# Patient Record
Sex: Male | Born: 1945 | Race: White | Hispanic: No | Marital: Married | Smoking: Current every day smoker
Health system: Southern US, Community
[De-identification: ages and names within clinical notes are randomized; demographics above are authoritative.]

## PROBLEM LIST (undated history)

## (undated) DIAGNOSIS — E7800 Pure hypercholesterolemia, unspecified: Secondary | ICD-10-CM

## (undated) DIAGNOSIS — D751 Secondary polycythemia: Secondary | ICD-10-CM

## (undated) DIAGNOSIS — K219 Gastro-esophageal reflux disease without esophagitis: Secondary | ICD-10-CM

## (undated) DIAGNOSIS — F1020 Alcohol dependence, uncomplicated: Secondary | ICD-10-CM

## (undated) DIAGNOSIS — I10 Essential (primary) hypertension: Secondary | ICD-10-CM

## (undated) DIAGNOSIS — M1990 Unspecified osteoarthritis, unspecified site: Secondary | ICD-10-CM

## (undated) DIAGNOSIS — J449 Chronic obstructive pulmonary disease, unspecified: Secondary | ICD-10-CM

## (undated) DIAGNOSIS — IMO0002 Reserved for concepts with insufficient information to code with codable children: Secondary | ICD-10-CM

## (undated) DIAGNOSIS — Z87891 Personal history of nicotine dependence: Secondary | ICD-10-CM

## (undated) HISTORY — DX: Unspecified osteoarthritis, unspecified site: M19.90

## (undated) HISTORY — DX: Chronic obstructive pulmonary disease, unspecified: J44.9

## (undated) HISTORY — DX: Personal history of nicotine dependence: Z87.891

## (undated) HISTORY — DX: Pure hypercholesterolemia, unspecified: E78.00

## (undated) HISTORY — DX: Reserved for concepts with insufficient information to code with codable children: IMO0002

## (undated) HISTORY — DX: Secondary polycythemia: D75.1

## (undated) HISTORY — DX: Essential (primary) hypertension: I10

## (undated) HISTORY — DX: Alcohol dependence, uncomplicated: F10.20

## (undated) HISTORY — DX: Gastro-esophageal reflux disease without esophagitis: K21.9

## (undated) HISTORY — PX: CHOLECYSTECTOMY: SHX55

---

## 2005-04-05 ENCOUNTER — Ambulatory Visit (HOSPITAL_COMMUNITY): Admission: RE | Admit: 2005-04-05 | Discharge: 2005-04-05 | Payer: Self-pay | Admitting: Pulmonary Disease

## 2005-04-05 ENCOUNTER — Ambulatory Visit: Payer: Self-pay | Admitting: Cardiology

## 2005-04-11 ENCOUNTER — Ambulatory Visit (HOSPITAL_COMMUNITY): Admission: RE | Admit: 2005-04-11 | Discharge: 2005-04-11 | Payer: Self-pay | Admitting: Internal Medicine

## 2005-04-26 ENCOUNTER — Ambulatory Visit (HOSPITAL_COMMUNITY): Admission: RE | Admit: 2005-04-26 | Discharge: 2005-04-26 | Payer: Self-pay | Admitting: Internal Medicine

## 2008-03-23 ENCOUNTER — Ambulatory Visit (HOSPITAL_COMMUNITY): Admission: RE | Admit: 2008-03-23 | Discharge: 2008-03-23 | Payer: Self-pay | Admitting: Internal Medicine

## 2008-03-29 ENCOUNTER — Emergency Department (HOSPITAL_COMMUNITY): Admission: EM | Admit: 2008-03-29 | Discharge: 2008-03-29 | Payer: Self-pay | Admitting: Emergency Medicine

## 2008-04-11 ENCOUNTER — Emergency Department (HOSPITAL_COMMUNITY): Admission: EM | Admit: 2008-04-11 | Discharge: 2008-04-11 | Payer: Self-pay | Admitting: Emergency Medicine

## 2008-04-13 ENCOUNTER — Encounter (HOSPITAL_COMMUNITY): Admission: RE | Admit: 2008-04-13 | Discharge: 2008-05-13 | Payer: Self-pay | Admitting: Emergency Medicine

## 2008-04-28 ENCOUNTER — Ambulatory Visit (HOSPITAL_COMMUNITY): Admission: RE | Admit: 2008-04-28 | Discharge: 2008-04-28 | Payer: Self-pay | Admitting: Internal Medicine

## 2009-11-01 ENCOUNTER — Other Ambulatory Visit: Admission: RE | Admit: 2009-11-01 | Discharge: 2009-11-01 | Payer: Self-pay | Admitting: Otolaryngology

## 2009-11-07 ENCOUNTER — Ambulatory Visit (HOSPITAL_COMMUNITY): Admission: RE | Admit: 2009-11-07 | Discharge: 2009-11-07 | Payer: Self-pay | Admitting: Internal Medicine

## 2009-11-14 ENCOUNTER — Encounter (HOSPITAL_COMMUNITY): Admission: RE | Admit: 2009-11-14 | Discharge: 2009-12-14 | Payer: Self-pay | Admitting: Internal Medicine

## 2009-11-23 ENCOUNTER — Ambulatory Visit (HOSPITAL_COMMUNITY): Payer: Self-pay | Admitting: Oncology

## 2009-11-23 ENCOUNTER — Encounter (HOSPITAL_COMMUNITY): Admission: RE | Admit: 2009-11-23 | Discharge: 2009-12-23 | Payer: Self-pay | Admitting: Oncology

## 2009-12-27 ENCOUNTER — Encounter (HOSPITAL_COMMUNITY): Admission: RE | Admit: 2009-12-27 | Discharge: 2010-01-26 | Payer: Self-pay | Admitting: Oncology

## 2010-02-06 ENCOUNTER — Ambulatory Visit (HOSPITAL_COMMUNITY): Payer: Self-pay | Admitting: Oncology

## 2010-02-06 ENCOUNTER — Encounter (HOSPITAL_COMMUNITY): Admission: RE | Admit: 2010-02-06 | Discharge: 2010-03-08 | Payer: Self-pay | Admitting: Oncology

## 2010-02-12 IMAGING — CT CT ANGIO ABDOMEN
1 of 4 series · 13 of 46 positions shown, 17 images · IV contrast (omnipaque)
Comparison: None

CTA CHEST

CLINICAL DATA: Chest and abdominal pain.  Rule out aortic
dissection.

CT ANGIOGRAPHY CHEST AND ABDOMEN
TECHNIQUE: Multidetector CT imaging of the chest and abdomen was
performed using the standard protocol during bolus administration
of intravenous contrast. Multiplanar CT image reconstructions were
obtained to evaluate the vascular anatomy.
Contrast: 100 ml Omnipaque 350

[Series 5: dissection 3.0 b40f · axial · 0.73mm/px · z∈[-509,-92]mm · 13 of 157 slices shown, 17 images]
[im 9/157  soft-tissue]
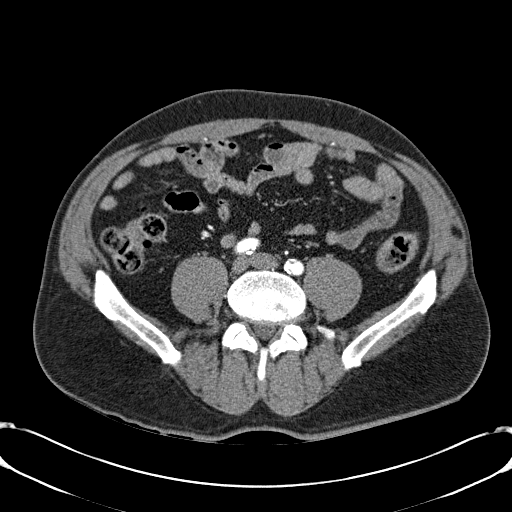
[im 9/157  bone]
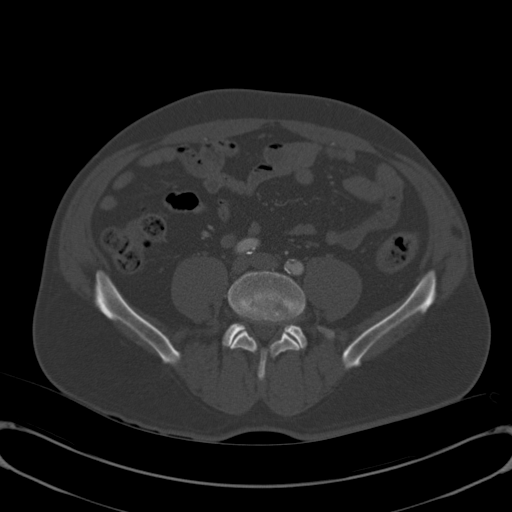
[im 25/157  soft-tissue]
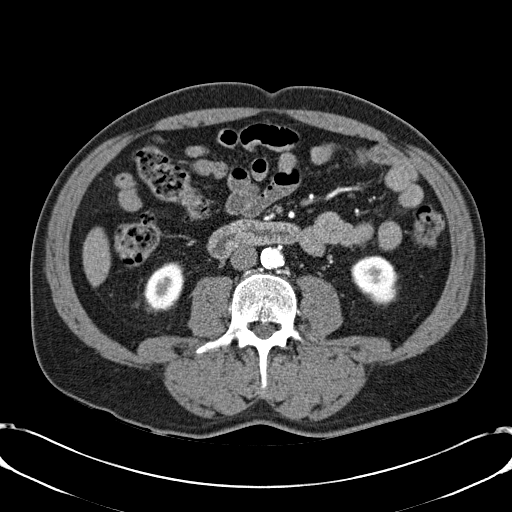
[im 42/157  soft-tissue]
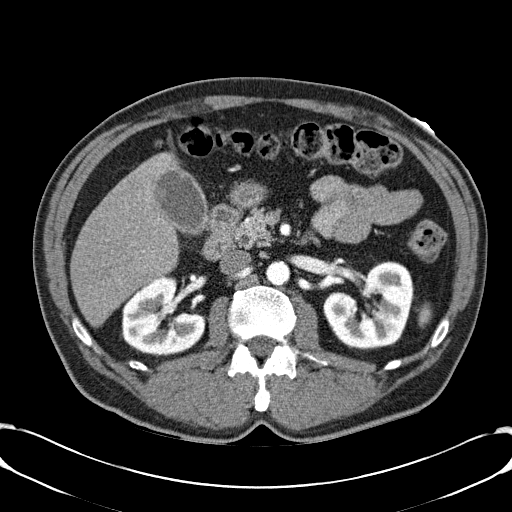
[im 50/157  soft-tissue]
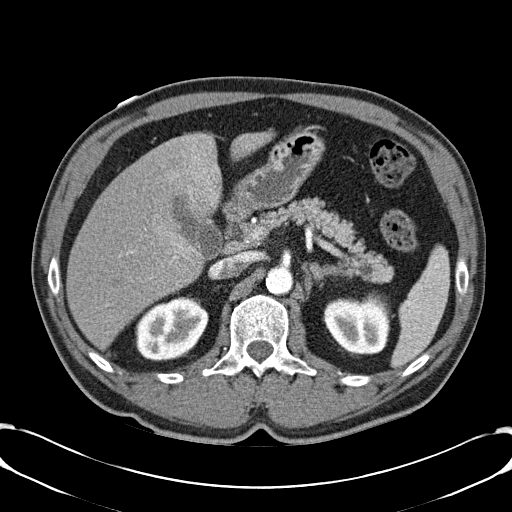
[im 66/157  soft-tissue]
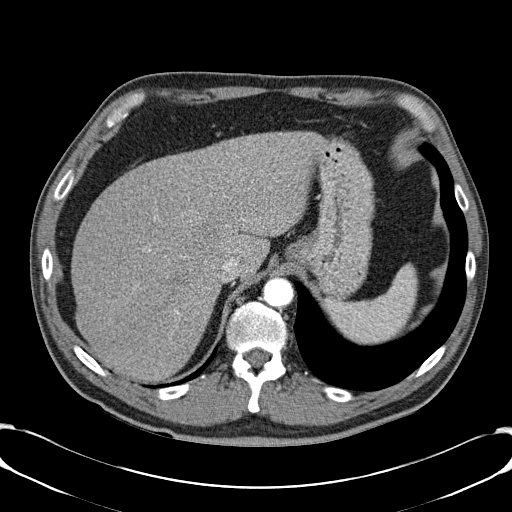
[im 83/157  soft-tissue]
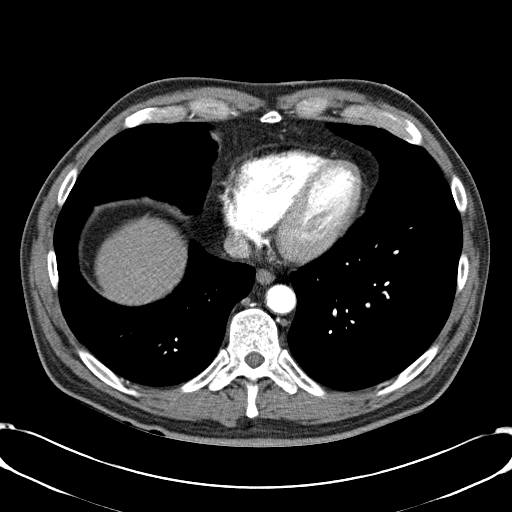
[im 91/157  soft-tissue]
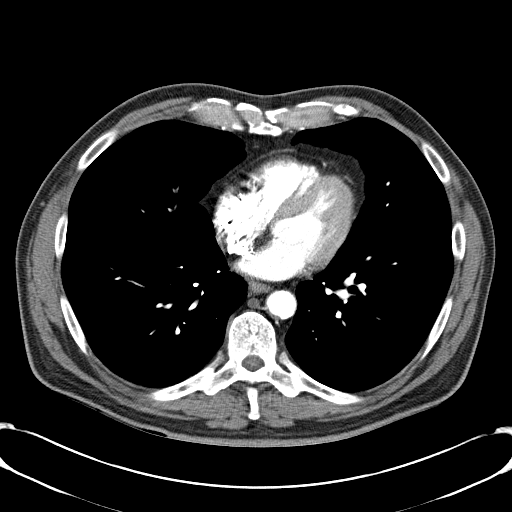
[im 107/157  soft-tissue]
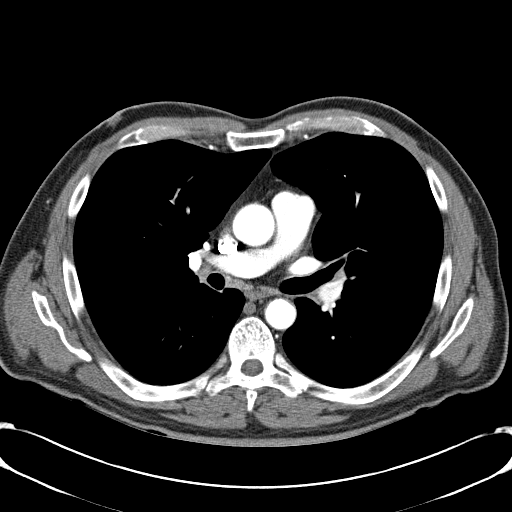
[im 115/157  soft-tissue]
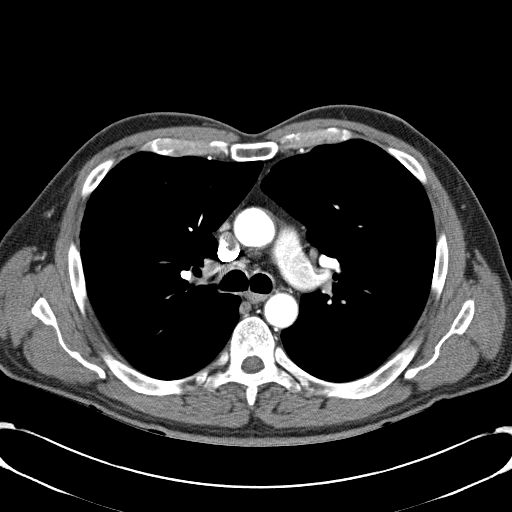
[im 115/157  bone]
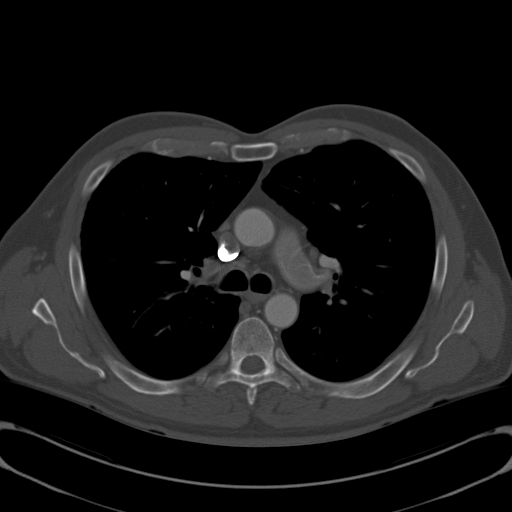
[im 124/157  lung]
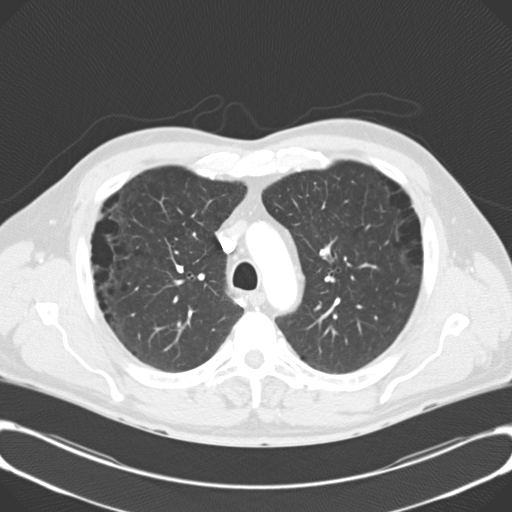
[im 132/157  soft-tissue]
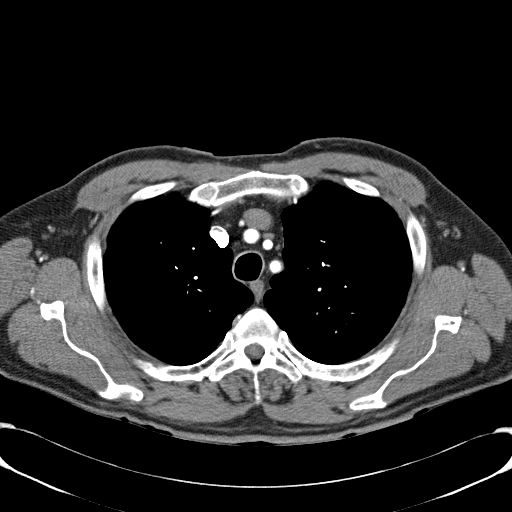
[im 132/157  lung]
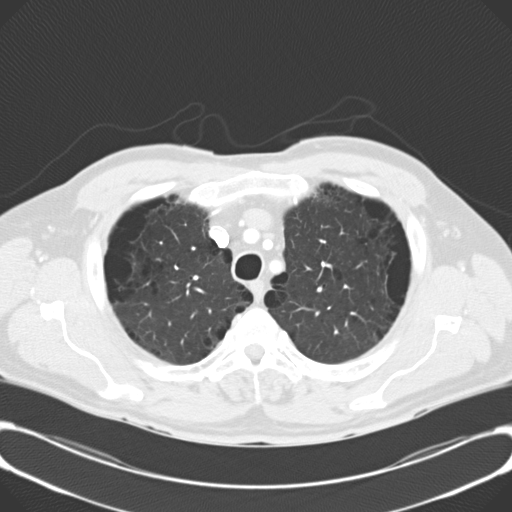
[im 140/157  lung]
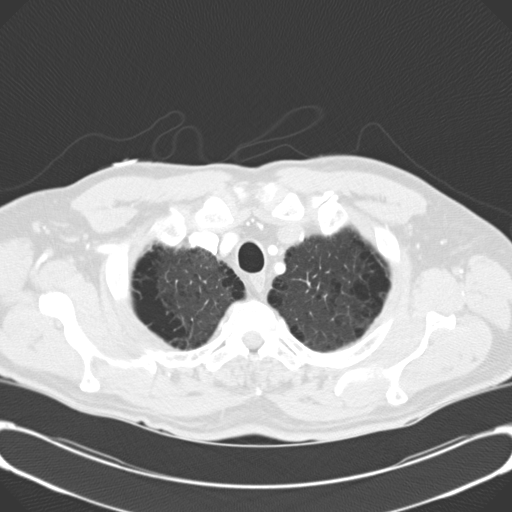
[im 148/157  soft-tissue]
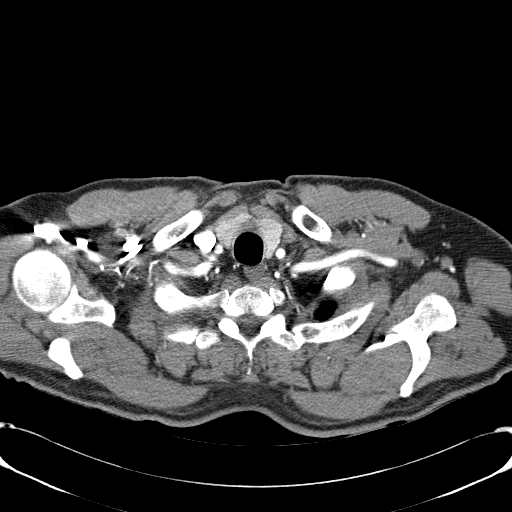
[im 148/157  lung]
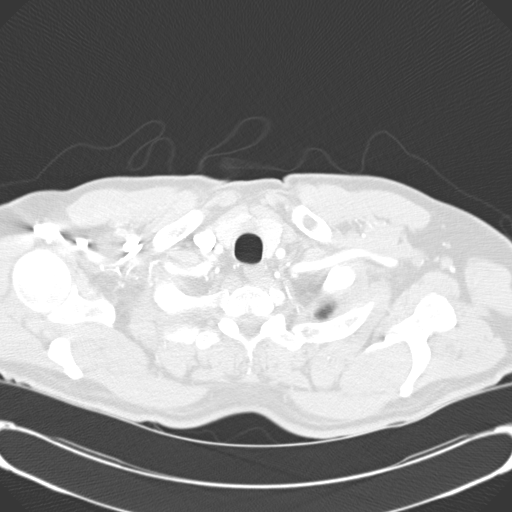

[13 of 46 positions shown; findings below may reference images not displayed]

FINDINGS: The study is negative for an aortic dissection or
aneurysm.  There is a normal configuration of the thoracic aorta
and great vessels.  The patient does have plaque involving the left
subclavian artery.  The proximal vertebral arteries are patent.
There is no significant pericardial or pleural fluid.  Limited
evaluation for pulmonary emboli due to technique but there is no
evidence for a large central emboli.  There are small lymph nodes
in the mediastinum which are not pathologically enlarged.  The
trachea and mainstem bronchi are patent.  The patient has
paraseptal emphysematous disease in the upper lobes.  There is a
focal area of ground-glass opacification in the left upper lobe
which may represent a small focus of infection but cannot exclude a
small area of neoplasm.  No acute bone abnormalities.
IMPRESSION: Small focus of parenchymal disease in the left upper lobe.
Findings may represent a small area of infection but also
concerning for a bronchioalveolar cell carcinoma (JORDEN).  Recommend
follow-up chest CT in 4-6 weeks.

Negative for aortic aneurysm or dissection.  There is mild
atherosclerotic disease and plaque involving the left subclavian
artery.

CTA ABDOMEN
FINDINGS: Negative for abdominal aortic aneurysm or dissection.
The visceral vessels are patent without significant stenosis.  The
distal abdominal aorta is heavily calcified.  The common iliac
arteries are also diseased but patent.  Mild fullness involving the
adrenal glands without focal abnormality.  Normal appearance of the
kidneys, pancreas, liver and stomach.  There is a diverticulum
involving the descending portion of the duodenum.  There appears to
be a prominent fold involving the gallbladder and cannot exclude
gallbladder wall thickening.  There is 5 mm low density involving
the right right anterior hepatic lobe on sequence #5, image 89
which is indeterminate.  There is another vague low density area
along the anterior left hepatic lobe measuring close to 1.3 cm on
sequence #5, image 90.  In addition, there are small nodules along
the posterior aspect of the right hepatic lobe.  These nodules
probably represent small lymph nodes and best seen on the sagittal
reformats, sequence #8 images 39 and and 35.  Nodes measure up to
1.1 cm.  The appendix is partially visualized with a normal
morphology.
IMPRESSION: Negative for abdominal aortic dissection or aneurysm.  The visceral
vessels are patent.

Wall thickening of the gallbladder.  These findings are nonspecific
but could be associated with inflammation.  Recommend further
characterization with ultrasound.

Indeterminate liver low density areas and small lymph nodes along
the posterior aspect the liver.  Based on the concerning
opacification in the left lung, recommend follow-up of this area in
4-6 weeks.

## 2010-03-13 ENCOUNTER — Encounter (HOSPITAL_COMMUNITY): Admission: RE | Admit: 2010-03-13 | Discharge: 2010-04-12 | Payer: Self-pay | Admitting: Oncology

## 2010-11-19 LAB — CBC
HCT: 41.3 % (ref 39.0–52.0)
Hemoglobin: 13.9 g/dL (ref 13.0–17.0)
MCH: 31.5 pg (ref 26.0–34.0)
MCHC: 33.8 g/dL (ref 30.0–36.0)
MCV: 93.1 fL (ref 78.0–100.0)
Platelets: 213 10*3/uL (ref 150–400)
RBC: 4.43 MIL/uL (ref 4.22–5.81)
RDW: 15.3 % (ref 11.5–15.5)
WBC: 8.9 10*3/uL (ref 4.0–10.5)

## 2010-11-20 LAB — CBC
HCT: 45 % (ref 39.0–52.0)
MCHC: 33.9 g/dL (ref 30.0–36.0)
MCV: 95.9 fL (ref 78.0–100.0)
Platelets: 275 10*3/uL (ref 150–400)
RBC: 4.69 MIL/uL (ref 4.22–5.81)
RDW: 14.7 % (ref 11.5–15.5)
WBC: 10.8 10*3/uL — ABNORMAL HIGH (ref 4.0–10.5)

## 2010-11-21 ENCOUNTER — Other Ambulatory Visit (HOSPITAL_COMMUNITY): Payer: Self-pay | Admitting: Oncology

## 2010-11-21 ENCOUNTER — Ambulatory Visit (HOSPITAL_COMMUNITY): Payer: BC Managed Care – PPO | Admitting: Oncology

## 2010-11-21 ENCOUNTER — Encounter (HOSPITAL_COMMUNITY): Payer: BC Managed Care – PPO | Attending: Oncology

## 2010-11-21 DIAGNOSIS — D751 Secondary polycythemia: Secondary | ICD-10-CM

## 2010-11-21 DIAGNOSIS — J4489 Other specified chronic obstructive pulmonary disease: Secondary | ICD-10-CM | POA: Insufficient documentation

## 2010-11-21 DIAGNOSIS — I10 Essential (primary) hypertension: Secondary | ICD-10-CM | POA: Insufficient documentation

## 2010-11-21 DIAGNOSIS — F17200 Nicotine dependence, unspecified, uncomplicated: Secondary | ICD-10-CM | POA: Insufficient documentation

## 2010-11-21 DIAGNOSIS — J449 Chronic obstructive pulmonary disease, unspecified: Secondary | ICD-10-CM | POA: Insufficient documentation

## 2010-11-21 LAB — THERAPEUTIC PHLEBOTOMY: Therapeutic Phlebotomy: 1

## 2010-11-22 LAB — THERAPEUTIC PHLEBOTOMY

## 2010-11-24 ENCOUNTER — Ambulatory Visit (HOSPITAL_COMMUNITY)
Admission: RE | Admit: 2010-11-24 | Discharge: 2010-11-24 | Disposition: A | Discharge status: Home / Self Care | Payer: BC Managed Care – PPO | Source: Ambulatory Visit | Attending: Oncology | Admitting: Oncology

## 2010-11-24 DIAGNOSIS — J4489 Other specified chronic obstructive pulmonary disease: Secondary | ICD-10-CM | POA: Insufficient documentation

## 2010-11-24 DIAGNOSIS — J449 Chronic obstructive pulmonary disease, unspecified: Secondary | ICD-10-CM | POA: Insufficient documentation

## 2010-11-24 DIAGNOSIS — F17200 Nicotine dependence, unspecified, uncomplicated: Secondary | ICD-10-CM | POA: Insufficient documentation

## 2010-11-24 DIAGNOSIS — D751 Secondary polycythemia: Secondary | ICD-10-CM

## 2010-11-26 LAB — DIFFERENTIAL
Basophils Absolute: 0.1 10*3/uL (ref 0.0–0.1)
Eosinophils Absolute: 0.4 10*3/uL (ref 0.0–0.7)
Eosinophils Relative: 3 % (ref 0–5)
Lymphs Abs: 4 10*3/uL (ref 0.7–4.0)

## 2010-11-26 LAB — COMPREHENSIVE METABOLIC PANEL
CO2: 28 mEq/L (ref 19–32)
Chloride: 99 mEq/L (ref 96–112)
Creatinine, Ser: 1.73 mg/dL — ABNORMAL HIGH (ref 0.4–1.5)
GFR calc Af Amer: 49 mL/min — ABNORMAL LOW (ref >60)
Glucose, Bld: 98 mg/dL (ref 70–99)

## 2010-11-26 LAB — CBC
HCT: 52 % (ref 39.0–52.0)
MCHC: 35.5 g/dL (ref 30.0–36.0)
MCV: 103.6 fL — ABNORMAL HIGH (ref 78.0–100.0)
Platelets: 224 10*3/uL (ref 150–400)
RBC: 5.02 MIL/uL (ref 4.22–5.81)
WBC: 12.4 10*3/uL — ABNORMAL HIGH (ref 4.0–10.5)

## 2010-11-26 LAB — URINALYSIS, ROUTINE W REFLEX MICROSCOPIC
Glucose, UA: NEGATIVE mg/dL
Hgb urine dipstick: NEGATIVE
Protein, ur: NEGATIVE mg/dL

## 2010-11-26 LAB — THERAPEUTIC PHLEBOTOMY
Volume Drawn: 500
Volume Drawn: 500
Volume Drawn: 500

## 2010-11-26 LAB — BLOOD GAS, ARTERIAL
Acid-Base Excess: 2.3 mmol/L — ABNORMAL HIGH (ref 0.0–2.0)
FIO2: 21 %
pCO2 arterial: 38.3 mmHg (ref 35.0–45.0)
pH, Arterial: 7.448 (ref 7.350–7.450)
pO2, Arterial: 80.9 mmHg (ref 80.0–100.0)

## 2010-11-26 LAB — ERYTHROPOIETIN: Erythropoietin: 33.7 m[IU]/mL (ref 2.6–34.0)

## 2010-11-29 ENCOUNTER — Encounter (HOSPITAL_COMMUNITY): Payer: BC Managed Care – PPO

## 2010-11-29 DIAGNOSIS — J449 Chronic obstructive pulmonary disease, unspecified: Secondary | ICD-10-CM

## 2010-11-29 DIAGNOSIS — D751 Secondary polycythemia: Secondary | ICD-10-CM

## 2010-12-06 ENCOUNTER — Encounter (HOSPITAL_COMMUNITY): Payer: BC Managed Care – PPO

## 2010-12-06 ENCOUNTER — Encounter (HOSPITAL_COMMUNITY): Payer: BC Managed Care – PPO | Attending: Oncology

## 2010-12-06 DIAGNOSIS — D45 Polycythemia vera: Secondary | ICD-10-CM

## 2010-12-13 ENCOUNTER — Encounter (HOSPITAL_COMMUNITY): Payer: BC Managed Care – PPO

## 2010-12-13 DIAGNOSIS — D751 Secondary polycythemia: Secondary | ICD-10-CM

## 2010-12-18 ENCOUNTER — Encounter: Payer: Self-pay | Admitting: Orthopedic Surgery

## 2011-01-02 ENCOUNTER — Ambulatory Visit: Payer: BC Managed Care – PPO | Admitting: Orthopedic Surgery

## 2011-01-12 ENCOUNTER — Other Ambulatory Visit (HOSPITAL_COMMUNITY): Payer: Self-pay | Admitting: Oncology

## 2011-01-12 ENCOUNTER — Encounter (HOSPITAL_COMMUNITY): Payer: BC Managed Care – PPO | Attending: Oncology

## 2011-01-12 DIAGNOSIS — D751 Secondary polycythemia: Secondary | ICD-10-CM

## 2011-01-12 DIAGNOSIS — D45 Polycythemia vera: Secondary | ICD-10-CM | POA: Insufficient documentation

## 2011-01-12 LAB — CBC
Hemoglobin: 14 g/dL (ref 13.0–17.0)
RBC: 4.66 MIL/uL (ref 4.22–5.81)
WBC: 7 10*3/uL (ref 4.0–10.5)

## 2011-01-19 NOTE — Procedures (Signed)
[name], Jason Evans             ACCOUNT NO.:  [id number]   MEDICAL RECORD NO.:  [id number]          PATIENT TYPE:  OUT   LOCATION:  RAD                           FACILITY:  [facility]   PHYSICIAN:  Marion Bing, M.D. [facility] OF BIRTH:  [birth year]   DATE OF PROCEDURE:  04/05/2005  DATE OF DISCHARGE:                                  ECHOCARDIOGRAM   REFERRING PHYSICIAN:  Kingsley Callander. Ouida Sills, M.D. and Oneal Deputy. Juanetta Gosling, M.D.   CLINICAL DATA:  A 65 year old gentleman with TIA, and hypertension.   M-MODE:  Aorta 3.0, left atrium 3.9, septum 1.3, posterior wall 1.1, LV  diastole 3.8, LV systole 2.3.   RESULTS:  1.  Technically adequate echocardiographic study.  2.  Normal left and right atrial size; normal right ventricular size;      borderline hypertrophy; normal function.  3.  Normal aortic valve; mild calcification of the proximal ascending aorta.  4.  Normal mitral valve; mild annular calcification.  5.  Normal tricuspid valve; physiologic regurgitation.  6.  Normal pulmonic valve and proximal pulmonary artery.  7.  Normal left ventricular size; mild septal hypertrophy; normal regional      and global function.  8.  Normal inferior vena cava.      [place] Bing, M.D. [facility]  Electronically Signed     RR/MEDQ  D:  04/05/2005  T:  04/06/2005  Job:  [id number]

## 2011-02-09 ENCOUNTER — Other Ambulatory Visit (HOSPITAL_COMMUNITY): Payer: Self-pay | Admitting: Oncology

## 2011-02-09 ENCOUNTER — Encounter (HOSPITAL_COMMUNITY): Payer: BC Managed Care – PPO | Attending: Oncology

## 2011-02-09 DIAGNOSIS — D45 Polycythemia vera: Secondary | ICD-10-CM | POA: Insufficient documentation

## 2011-02-09 DIAGNOSIS — D751 Secondary polycythemia: Secondary | ICD-10-CM

## 2011-02-09 LAB — CBC
HCT: 45.8 % (ref 39.0–52.0)
Hemoglobin: 15.1 g/dL (ref 13.0–17.0)
MCV: 91.2 fL (ref 78.0–100.0)
RBC: 5.02 MIL/uL (ref 4.22–5.81)
WBC: 6.4 10*3/uL (ref 4.0–10.5)

## 2011-02-14 ENCOUNTER — Encounter (HOSPITAL_COMMUNITY): Payer: BC Managed Care – PPO

## 2011-02-14 DIAGNOSIS — D45 Polycythemia vera: Secondary | ICD-10-CM

## 2011-04-11 ENCOUNTER — Other Ambulatory Visit (HOSPITAL_COMMUNITY): Payer: BC Managed Care – PPO

## 2011-04-13 ENCOUNTER — Other Ambulatory Visit (HOSPITAL_COMMUNITY): Payer: BC Managed Care – PPO

## 2011-05-04 ENCOUNTER — Other Ambulatory Visit: Payer: Self-pay | Admitting: Urology

## 2011-05-04 ENCOUNTER — Ambulatory Visit (INDEPENDENT_AMBULATORY_CARE_PROVIDER_SITE_OTHER): Payer: Medicare Other | Admitting: Urology

## 2011-05-04 DIAGNOSIS — R3129 Other microscopic hematuria: Secondary | ICD-10-CM

## 2011-05-04 DIAGNOSIS — N401 Enlarged prostate with lower urinary tract symptoms: Secondary | ICD-10-CM

## 2011-05-11 ENCOUNTER — Ambulatory Visit (HOSPITAL_COMMUNITY)
Admission: RE | Admit: 2011-05-11 | Discharge: 2011-05-11 | Disposition: A | Discharge status: Home / Self Care | Payer: Medicare Other | Source: Ambulatory Visit | Attending: Urology | Admitting: Urology

## 2011-05-11 DIAGNOSIS — R109 Unspecified abdominal pain: Secondary | ICD-10-CM | POA: Insufficient documentation

## 2011-05-11 DIAGNOSIS — R3129 Other microscopic hematuria: Secondary | ICD-10-CM

## 2011-05-11 DIAGNOSIS — I7789 Other specified disorders of arteries and arterioles: Secondary | ICD-10-CM | POA: Insufficient documentation

## 2011-05-11 DIAGNOSIS — R319 Hematuria, unspecified: Secondary | ICD-10-CM | POA: Insufficient documentation

## 2011-05-11 MED ORDER — IOHEXOL 300 MG/ML  SOLN
100.0000 mL | Freq: Once | INTRAMUSCULAR | Status: AC | PRN
  Administered 2011-05-11: 100 mL via INTRAVENOUS

## 2011-05-25 ENCOUNTER — Encounter (HOSPITAL_COMMUNITY): Payer: Self-pay | Admitting: Oncology

## 2011-05-25 ENCOUNTER — Encounter (HOSPITAL_COMMUNITY): Payer: Medicare Other | Attending: Oncology | Admitting: Oncology

## 2011-05-25 VITALS — BP 137/77 | HR 84 | Temp 98.1°F | Ht 68.0 in | Wt 166.2 lb

## 2011-05-25 DIAGNOSIS — F17200 Nicotine dependence, unspecified, uncomplicated: Secondary | ICD-10-CM | POA: Insufficient documentation

## 2011-05-25 DIAGNOSIS — D751 Secondary polycythemia: Secondary | ICD-10-CM | POA: Insufficient documentation

## 2011-05-25 DIAGNOSIS — J449 Chronic obstructive pulmonary disease, unspecified: Secondary | ICD-10-CM

## 2011-05-25 DIAGNOSIS — J4489 Other specified chronic obstructive pulmonary disease: Secondary | ICD-10-CM | POA: Insufficient documentation

## 2011-05-25 LAB — CBC
HCT: 44.7 % (ref 39.0–52.0)
Hemoglobin: 15 g/dL (ref 13.0–17.0)
MCH: 31.4 pg (ref 26.0–34.0)
MCHC: 33.6 g/dL (ref 30.0–36.0)
RBC: 4.77 MIL/uL (ref 4.22–5.81)

## 2011-05-25 NOTE — Patient Instructions (Signed)
[facility]  Discharge Instructions  RECOMMENDATIONS MADE BY THE CONSULTANT AND ANY TEST RESULTS WILL BE SENT TO YOUR REFERRING DOCTOR.        SPECIAL INSTRUCTIONS/FOLLOW-UP: We will do lab work today. We will call you with any abnormal results. Return to see MD in 3 months.   I acknowledge that I have been informed and understand all the instructions given to me and received a copy. I do not have any more questions at this time, but understand that I may call the Specialty Clinic at [facility] at [phone] during business hours should I have any further questions or need assistance in obtaining follow-up care.    __________________________________________  _____________  __________ Signature of Patient or Authorized Representative            Date                   Time    __________________________________________ Nurse's Signature

## 2011-05-25 NOTE — Progress Notes (Signed)
CC:   Jason Evans. Ouida Sills, MD  DIAGNOSES: 1. 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  He     had a pO2 of 80, carbon monoxide hemoglobin value of 6.5%, and     negative JAK mutation evaluation.  No kidney cancer, no lung     cancer.  An EPO level was at the high end of normal consistent with     hypoxia-driven polycythemia.  He also had no evidence of     splenomegaly, and he is here for followup. 2. Chronic obstructive pulmonary disease, still smoking a pack a day. 3. Excessive alcohol use, still drinking about 2 mixed drinks a night,     he states. 4. Hypertriglyceridemia. 5. Hypertension. 6. Degenerative disk disease. 7. Cholecystectomy in 2009. 8. Codeine-induced urticaria. 9. Shrapnel removal from his right knee and right arm during the     [place] War.  Silvio's vital signs are very stable.  He has a height of 5 feet 8 inches, weight is 166 pounds, and that is down from 170 in March.  His blood counts are pending from today, but he is here for followup and he feels well, but I have encouraged him to quit smoking, let us check his hemoglobin and see where we need to go from there.  He has not been donated any blood.  He will see [place] back in a few months.  Once again I told him most of his problems are coming from his COPD.  He states he has a PSA scheduled with Dr. Ouida Sills on Monday at an outpatient lab.  From our standpoint, we will just see him back in 3 months as long as his hemoglobin is stable here.  Will see what it is at the end of the day.    ______________________________ Ladona Horns. Mariel Sleet, MD ESN/MEDQ  D:  05/25/2011  T:  05/25/2011  Job:  [id number]

## 2011-05-25 NOTE — Progress Notes (Signed)
This office note has been dictated.

## 2011-05-28 ENCOUNTER — Telehealth (HOSPITAL_COMMUNITY): Payer: Self-pay

## 2011-05-28 NOTE — Telephone Encounter (Signed)
Informed re: repeat cbc in 3 months.  Scheduled for 12/14 @ 8:40.

## 2011-06-01 ENCOUNTER — Ambulatory Visit (INDEPENDENT_AMBULATORY_CARE_PROVIDER_SITE_OTHER): Payer: Medicare Other | Admitting: Urology

## 2011-06-01 DIAGNOSIS — R3129 Other microscopic hematuria: Secondary | ICD-10-CM

## 2011-06-01 DIAGNOSIS — N401 Enlarged prostate with lower urinary tract symptoms: Secondary | ICD-10-CM

## 2011-06-01 LAB — DIFFERENTIAL
Basophils Absolute: 0.1
Basophils Relative: 1
Basophils Relative: 1
Eosinophils Absolute: 0.1
Lymphocytes Relative: 19
Lymphs Abs: 2
Monocytes Absolute: 2.5 — ABNORMAL HIGH
Monocytes Absolute: 0.8
Monocytes Relative: 9
Neutro Abs: 16.8 — ABNORMAL HIGH
Neutrophils Relative %: 70
Neutrophils Relative %: 65

## 2011-06-01 LAB — COMPREHENSIVE METABOLIC PANEL
ALT: 48
ALT: 138 — ABNORMAL HIGH
AST: 186 — ABNORMAL HIGH
Albumin: 3.4 — ABNORMAL LOW
Alkaline Phosphatase: 182 — ABNORMAL HIGH
CO2: 26
Calcium: 9.8
Calcium: 10.3
Chloride: 95 — ABNORMAL LOW
Creatinine, Ser: 1.48
GFR calc Af Amer: 59 — ABNORMAL LOW
GFR calc non Af Amer: 48 — ABNORMAL LOW
Glucose, Bld: 122 — ABNORMAL HIGH
Glucose, Bld: 137 — ABNORMAL HIGH
Potassium: 3.9
Sodium: 139
Total Bilirubin: 1.5 — ABNORMAL HIGH
Total Protein: 7

## 2011-06-01 LAB — POCT CARDIAC MARKERS
CKMB, poc: 1.6
Myoglobin, poc: 111
Troponin i, poc: <0.05

## 2011-06-01 LAB — CBC
Hemoglobin: 18.3 — ABNORMAL HIGH
Hemoglobin: 16.4
MCHC: 34.3
MCHC: 34.2
Platelets: 200
RBC: 5.3
RDW: 13.8
RDW: 13.1

## 2011-06-01 LAB — LIPASE, BLOOD: Lipase: 22

## 2011-06-26 ENCOUNTER — Encounter (HOSPITAL_COMMUNITY): Payer: Self-pay | Admitting: Urology

## 2011-08-17 ENCOUNTER — Encounter (HOSPITAL_COMMUNITY): Payer: Medicare Other | Attending: Oncology

## 2011-08-17 ENCOUNTER — Other Ambulatory Visit (HOSPITAL_COMMUNITY): Payer: Medicare Other

## 2011-08-24 ENCOUNTER — Ambulatory Visit (HOSPITAL_COMMUNITY): Payer: Medicare Other | Admitting: Oncology

## 2011-09-03 ENCOUNTER — Other Ambulatory Visit (HOSPITAL_COMMUNITY): Payer: Medicare Other

## 2011-09-07 ENCOUNTER — Ambulatory Visit (HOSPITAL_COMMUNITY): Payer: Medicare Other | Admitting: Oncology

## 2011-09-10 ENCOUNTER — Encounter (HOSPITAL_COMMUNITY): Payer: Medicare Other | Attending: Oncology

## 2011-09-10 DIAGNOSIS — D751 Secondary polycythemia: Secondary | ICD-10-CM

## 2011-09-10 LAB — CBC
HCT: 46.7 % (ref 39.0–52.0)
MCH: 33.5 pg (ref 26.0–34.0)
MCV: 97.7 fL (ref 78.0–100.0)
RDW: 13.3 % (ref 11.5–15.5)
WBC: 9.7 10*3/uL (ref 4.0–10.5)

## 2011-09-10 NOTE — Progress Notes (Signed)
Labs drawn today for cbc 

## 2011-09-14 ENCOUNTER — Encounter (HOSPITAL_BASED_OUTPATIENT_CLINIC_OR_DEPARTMENT_OTHER): Payer: Medicare Other | Admitting: Oncology

## 2011-09-14 ENCOUNTER — Encounter (HOSPITAL_COMMUNITY): Payer: Self-pay | Admitting: Oncology

## 2011-09-14 DIAGNOSIS — D751 Secondary polycythemia: Secondary | ICD-10-CM

## 2011-09-14 DIAGNOSIS — J449 Chronic obstructive pulmonary disease, unspecified: Secondary | ICD-10-CM

## 2011-09-14 DIAGNOSIS — F17200 Nicotine dependence, unspecified, uncomplicated: Secondary | ICD-10-CM

## 2011-09-14 DIAGNOSIS — F1020 Alcohol dependence, uncomplicated: Secondary | ICD-10-CM

## 2011-09-14 HISTORY — DX: Secondary polycythemia: D75.1

## 2011-09-14 HISTORY — DX: Chronic obstructive pulmonary disease, unspecified: J44.9

## 2011-09-14 HISTORY — DX: Alcohol dependence, uncomplicated: F10.20

## 2011-09-14 NOTE — Patient Instructions (Signed)
Jason Evans  [id number] 03-17-1946   [facility]  Discharge Instructions  RECOMMENDATIONS MADE BY THE CONSULTANT AND ANY TEST RESULTS WILL BE SENT TO YOUR REFERRING DOCTOR.   EXAM FINDINGS BY MD TODAY AND SIGNS AND SYMPTOMS TO REPORT TO CLINIC OR PRIMARY MD: Great job on stopping smoking.  Hopefully over time your red blood cell count will come down.  MEDICATIONS PRESCRIBED: none   INSTRUCTIONS GIVEN AND DISCUSSED: Other :  Report any symptoms of stroke such as inability to speak, loss of use of extremity, etc.  SPECIAL INSTRUCTIONS/FOLLOW-UP: Return to Clinic on :  Phlebotomy next week, labs in 3 months and follow-up with PA after results back.    I acknowledge that I have been informed and understand all the instructions given to me and received a copy. I do not have any more questions at this time, but understand that I may call the Specialty Clinic at [facility] at [phone] during business hours should I have any further questions or need assistance in obtaining follow-up care.    __________________________________________  _____________  __________ Signature of Patient or Authorized Representative            Date                   Time    __________________________________________ Nurse's Signature

## 2011-09-14 NOTE — Progress Notes (Signed)
Carylon Perches, MD [address] [place] [address]  1. Polycythemia secondary to smoking  Phlebotomy therapeutic, CBC  2. COPD (chronic obstructive pulmonary disease)    3. ETOHism      CURRENT THERAPY: Intermittent phlebotomy  INTERVAL HISTORY: Jason Evans 66 y.o. male returns for  regular  visit for followup of polycythemia secondary to severe COPD from smoking.   The patient reports that he quit his tobacco abuse shortly after his last visit to the clinic in September 2012.  He utilized the nicoderm patch, nicoderm gum and Wellbutrin.  He is now off of the Wellbutrin and the patch.  He does admit to an occasional recreational cigarette to remind him of how horrible a cigarette tastes.  I congratulated him this accomplishment.   I personally reviewed and went over laboratory results with the patient. His Hgb is 16.0 and will require a therapeutic phlebotomy.   He denies any other complaints.  He does report some pruritis secondary to utilizing a new soap.  He has now reverted back to his original brand of soap and it is improved/resolved.   Complete ROS questioning is negative.  He reports that his breathing is much better and he is not wheezing like he once did.  He feels better now that he quit smoking.   Past Medical History  Diagnosis Date  . Arthritis   . Esophageal reflux   . Hypercholesteremia   . Hypertension   . COPD (chronic obstructive pulmonary disease)   . Polycythemia   . DDD (degenerative disc disease)   . Polycythemia secondary to smoking 09/14/2011  . COPD (chronic obstructive pulmonary disease) 09/14/2011  . ETOHism 09/14/2011    has Polycythemia secondary to smoking; COPD (chronic obstructive pulmonary disease); and ETOHism on his problem list.     is allergic to codeine.  Jason Evans does not currently have medications on file.  Past Surgical History  Procedure Date  . Cholecystectomy     Denies any headaches, dizziness, double  vision, fevers, chills, night sweats, nausea, vomiting, diarrhea, constipation, chest pain, heart palpitations, shortness of breath, blood in stool, black tarry stool, urinary pain, urinary burning, urinary frequency, hematuria.   PHYSICAL EXAMINATION  ECOG PERFORMANCE STATUS: 1 - Symptomatic but completely ambulatory  Filed Vitals:   09/14/11 1143  BP: 119/81  Pulse: 103  Temp: 98.1 F (36.7 C)    GENERAL:alert, no distress, well nourished, well developed, comfortable, cooperative, smiling and redness of the face and poor facial skin quality secondary to smoking and/or EtOH. SKIN: skin color, texture, turgor are normal, no rashes or significant lesions HEAD: Normocephalic, No masses, lesions, tenderness or abnormalities EYES: normal EARS: External ears normal OROPHARYNX:mucous membranes are moist  NECK: supple, no adenopathy, trachea midline LYMPH:  no palpable lymphadenopathy, no hepatosplenomegaly BREAST:not examined LUNGS: clear to auscultation and percussion HEART: regular rate & rhythm, no murmurs, no gallops, S1 normal and S2 normal ABDOMEN:abdomen soft, non-tender, normal bowel sounds and no hepatosplenomegaly BACK: Back symmetric, no curvature., No CVA tenderness EXTREMITIES:less then 2 second capillary refill, no joint deformities, effusion, or inflammation, no edema, no skin discoloration, no cyanosis  NEURO: alert & oriented x 3 with fluent speech, no focal motor/sensory deficits, gait normal   LABORATORY DATA: CBC    Component Value Date/Time   WBC 9.7 09/10/2011 1009   RBC 4.78 09/10/2011 1009   HGB 16.0 09/10/2011 1009   HCT 46.7 09/10/2011 1009   PLT 209 09/10/2011 1009   MCV 97.7  09/10/2011 1009   MCH 33.5 09/10/2011 1009   MCHC 34.3 09/10/2011 1009   RDW 13.3 09/10/2011 1009   LYMPHSABS 4.0 11/23/2009 1554   MONOABS 1.7* 11/23/2009 1554   EOSABS 0.4 11/23/2009 1554   BASOSABS 0.1 11/23/2009 1554    Lab Results  Component Value Date   HGB 16.0 09/10/2011        ASSESSMENT:  1. 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 He had a pO2 of 80, carbon monoxide hemoglobin value of 6.5%, and negative JAK mutation evaluation. No kidney cancer, no lung cancer. An EPO level was at the high end of normal consistent with  hypoxia-driven polycythemia. He also had no evidence of  splenomegaly, and he is here for followup. Quit smoking in September/October 2012. 2. Chronic obstructive pulmonary disease, still smoking a pack a day.  3. Excessive alcohol use, still drinking about 2 mixed drinks a night, he states.  4. Hypertriglyceridemia.  5. Hypertension.  6. Degenerative disk disease.  7. Cholecystectomy in 2009.  8. Codeine-induced urticaria.  9. Shrapnel removal from his right knee and right arm during the [place] War.   PLAN:  1. Therapeutic phlebotomy within the next two weeks.  2. I personally reviewed and went over laboratory results with the patient. 3. Lab work in 3 months: CBC 4. Return in 3 months for follow-up.   All questions were answered. The patient knows to call the clinic with any problems, questions or concerns. We can certainly see the patient much sooner if necessary.  The patient and plan discussed with Glenford Peers, MD and he is in agreement with the aforementioned.  Jason Evans

## 2011-09-17 ENCOUNTER — Telehealth (HOSPITAL_COMMUNITY): Payer: Self-pay | Admitting: *Deleted

## 2011-09-17 NOTE — Telephone Encounter (Signed)
No need for a note - don't know why it is asking for one.

## 2011-09-21 ENCOUNTER — Encounter (HOSPITAL_BASED_OUTPATIENT_CLINIC_OR_DEPARTMENT_OTHER): Payer: Medicare Other

## 2011-09-21 ENCOUNTER — Other Ambulatory Visit (HOSPITAL_COMMUNITY): Payer: Self-pay | Admitting: *Deleted

## 2011-09-21 DIAGNOSIS — D751 Secondary polycythemia: Secondary | ICD-10-CM

## 2011-09-21 NOTE — Progress Notes (Signed)
Jason Evans presents today for phlebotomy per MD orders. Phlebotomy procedure started at 1107 and ended at 1114. 500 grams removed. Patient observed for 30 minutes after procedure without any incident. Patient tolerated procedure well. IV needle removed intact.

## 2011-10-19 ENCOUNTER — Encounter (HOSPITAL_COMMUNITY): Payer: Medicare Other | Attending: Oncology

## 2011-10-19 DIAGNOSIS — D751 Secondary polycythemia: Secondary | ICD-10-CM | POA: Insufficient documentation

## 2011-10-19 LAB — CBC
HCT: 41.4 % (ref 39.0–52.0)
MCH: 32.6 pg (ref 26.0–34.0)
MCHC: 34.1 g/dL (ref 30.0–36.0)
MCV: 95.8 fL (ref 78.0–100.0)
RDW: 12.6 % (ref 11.5–15.5)
WBC: 8.3 10*3/uL (ref 4.0–10.5)

## 2011-10-19 NOTE — Progress Notes (Signed)
Labs drawn today for cbc 

## 2011-12-11 ENCOUNTER — Other Ambulatory Visit (HOSPITAL_COMMUNITY): Payer: Medicare Other

## 2011-12-13 ENCOUNTER — Ambulatory Visit (HOSPITAL_COMMUNITY): Payer: Medicare Other | Admitting: Oncology

## 2011-12-17 ENCOUNTER — Encounter (HOSPITAL_COMMUNITY): Payer: Medicare Other | Attending: Oncology

## 2011-12-17 DIAGNOSIS — D751 Secondary polycythemia: Secondary | ICD-10-CM

## 2011-12-17 LAB — CBC
Hemoglobin: 14.5 g/dL (ref 13.0–17.0)
MCH: 32.2 pg (ref 26.0–34.0)
MCHC: 34.1 g/dL (ref 30.0–36.0)

## 2011-12-18 NOTE — Progress Notes (Signed)
Lab draw

## 2011-12-21 ENCOUNTER — Encounter (HOSPITAL_COMMUNITY): Payer: Self-pay | Admitting: Oncology

## 2011-12-21 ENCOUNTER — Encounter (HOSPITAL_BASED_OUTPATIENT_CLINIC_OR_DEPARTMENT_OTHER): Payer: Medicare Other | Admitting: Oncology

## 2011-12-21 VITALS — BP 121/77 | HR 79 | Temp 97.5°F | Wt 188.2 lb

## 2011-12-21 DIAGNOSIS — Z87891 Personal history of nicotine dependence: Secondary | ICD-10-CM

## 2011-12-21 DIAGNOSIS — I10 Essential (primary) hypertension: Secondary | ICD-10-CM

## 2011-12-21 DIAGNOSIS — R42 Dizziness and giddiness: Secondary | ICD-10-CM

## 2011-12-21 DIAGNOSIS — J449 Chronic obstructive pulmonary disease, unspecified: Secondary | ICD-10-CM

## 2011-12-21 DIAGNOSIS — D751 Secondary polycythemia: Secondary | ICD-10-CM

## 2011-12-21 HISTORY — DX: Personal history of nicotine dependence: Z87.891

## 2011-12-21 NOTE — Progress Notes (Signed)
Jason Perches, MD, MD [address] [place] [address]  1. Polycythemia secondary to smoking   2. COPD (chronic obstructive pulmonary disease)     CURRENT THERAPY:Intermittent phlebotomy.  Last phlebotomy was 09/21/2011   INTERVAL HISTORY: Jason Evans 66 y.o. male returns for  regular  visit for followup of secondary  polycythemia secondary to severe COPD from smoking.  He had a pO2 of 80, carbonmonoxide hemoglobin value of 6.5%, and negative JAK mutation evaluation. No kidney cancer, no lung cancer. An EPO level was at the high end of normal consistent with hypoxia-driven polycythemia. He also had no evidence of splenomegaly.  The patient reports that he quit smoking tobacco and quit drinking EtOH in Feb 2013.  He reports that he was a heavy drinker, drinking 2 gallons of Bourbon weekly.  His drink of choice was H+H.    He complains of weight gain.  He reports that his abdomen is larger which bothers him.  He admits that he is not making the healthiest of eating choices.  He reports that he consumes chocolates and ice cream.  He is juicing.  He has a fruit juice in the AM and a veggie juice in the PM in addition to his meals and snacks.  I have encouraged him to switch the veggie juice in the AM and the fruit juice in the PM.  I provided education regarding craving sweets if he starts his day with sweets such as sugars from fruits.  I have offered him a nutritionist consult, which he declines.   He reports dizziness with quick positional changes.  I provided him with some patient education regarding orthostatic hypotension.  He understands and is appreciative of the education.   I personally reviewed and went over laboratory results with the patient.  He understands our goal is to maintain a Hct of 45% or less. I educated him on this goal and the new research that makes this recommendation (instead of a Hgb less than 15).    Past Medical History  Diagnosis Date  .  Arthritis   . Esophageal reflux   . Hypercholesteremia   . Hypertension   . COPD (chronic obstructive pulmonary disease)   . Polycythemia   . DDD (degenerative disc disease)   . Polycythemia secondary to smoking 09/14/2011  . COPD (chronic obstructive pulmonary disease) 09/14/2011  . ETOHism 09/14/2011    has Polycythemia secondary to smoking; COPD (chronic obstructive pulmonary disease); and ETOHism on his problem list.     is allergic to codeine.  Jason Evans does not currently have medications on file.  Past Surgical History  Procedure Date  . Cholecystectomy     Denies any headaches, dizziness, double vision, fevers, chills, night sweats, nausea, vomiting, diarrhea, constipation, chest pain, heart palpitations, shortness of breath, blood in stool, black tarry stool, urinary pain, urinary burning, urinary frequency, hematuria.   PHYSICAL EXAMINATION  ECOG PERFORMANCE STATUS: 1 - Symptomatic but completely ambulatory  There were no vitals filed for this visit.  GENERAL:alert, no distress, well nourished, well developed, comfortable, cooperative and smiling SKIN: skin color, texture, turgor are normal, no rashes or significant lesions, thickened on face from smoking.  HEAD: Normocephalic, No masses, lesions, tenderness or abnormalities EYES: normal, EOMI, Conjunctiva are pink and non-injected EARS: External ears normal OROPHARYNX:lips, buccal mucosa, and tongue normal and mucous membranes are moist  NECK: supple, no adenopathy, no stridor, non-tender, trachea midline LYMPH:  no palpable lymphadenopathy, no hepatosplenomegaly BREAST:not examined LUNGS: decreased  breath sounds, expiratory wheezes bilaterally HEART: regular rate & rhythm, no murmurs, no gallops, S1 normal and S2 normal ABDOMEN:abdomen soft, non-tender, obese, normal bowel sounds, no masses or organomegaly and no hepatosplenomegaly BACK: Back symmetric, no curvature., No CVA tenderness EXTREMITIES:less then 2  second capillary refill, no joint deformities, effusion, or inflammation, no edema, no skin discoloration, no clubbing, no cyanosis  NEURO: alert & oriented x 3 with fluent speech, no focal motor/sensory deficits, gait normal   LABORATORY DATA: CBC    Component Value Date/Time   WBC 10.9* 12/17/2011 0949   RBC 4.51 12/17/2011 0949   HGB 14.5 12/17/2011 0949   HCT 42.5 12/17/2011 0949   PLT 297 12/17/2011 0949   MCV 94.2 12/17/2011 0949   MCH 32.2 12/17/2011 0949   MCHC 34.1 12/17/2011 0949   RDW 12.5 12/17/2011 0949   LYMPHSABS 4.0 11/23/2009 1554   MONOABS 1.7* 11/23/2009 1554   EOSABS 0.4 11/23/2009 1554   BASOSABS 0.1 11/23/2009 1554     Results for Jason Evans (MRN [id number]) as of 12/21/2011 09:51  Ref. Range 05/25/2011 14:37 06/01/2011 15:40 09/10/2011 10:09 10/19/2011 09:03 12/17/2011 09:49  HGB Latest Range: 13.0-17.0 g/dL 62.1  30.8 65.7 84.6  HCT Latest Range: 39.0-52.0 % 44.7  46.7 41.4 42.5      ASSESSMENT:  1. 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 He had a pO2 of 80, carbon monoxide hemoglobin value of 6.5%, and negative JAK mutation evaluation. No kidney cancer, no lung cancer. An EPO level was at the high end of normal consistent with hypoxia-driven polycythemia. He also had no evidence of  splenomegaly, and he is here for followup. Quit smoking in September/October 2012.  2. Chronic obstructive pulmonary disease, quit smoking 3. Excessive alcohol use, still drinking about 2 mixed drinks a night, he states.  4. Hypertriglyceridemia.  5. Hypertension.  6. Degenerative disk disease.  7. Cholecystectomy in 2009.  8. Codeine-induced urticaria.  9. Shrapnel removal from his right knee and right arm during the [place] War 10. 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 Drank 2 gallons of Bourbon weekly.   PLAN:  1. I personally reviewed and went over laboratory results with the patient. 2. Goal of  care is to maintain a Hct of 45% or less.  Education provided to the patient regarding this information. 3. Phlebotomize as needed.  4. Monthly lab work: CBC 5. Congratulated patient on quitting smoking and EtOH. 6. Nutrition education, please see HPI 7. Patient declined nutritionist consult for healthy eating choices. 8. Return in 4 months for follow-up.  Will phlebotomize according to lab work.  Again, goal is to maintain a Hct of 45% or less.   All questions were answered. The patient knows to call the clinic with any problems, questions or concerns. We can certainly see the patient much sooner if necessary.  The patient and plan discussed with Glenford Peers, MD and he is in agreement with the aforementioned.  Taunia Frasco

## 2011-12-21 NOTE — Patient Instructions (Addendum)
[facility]  Discharge Instructions  RECOMMENDATIONS MADE BY THE CONSULTANT AND ANY TEST RESULTS WILL BE SENT TO YOUR REFERRING DOCTOR.   EXAM FINDINGS BY MD TODAY AND SIGNS AND SYMPTOMS TO REPORT TO CLINIC OR PRIMARY MD: Your exam is good   SPECIAL INSTRUCTIONS/FOLLOW-UP: Labs monthly and sees Tom in 4 months.    I acknowledge that I have been informed and understand all the instructions given to me and received a copy. I do not have any more questions at this time, but understand that I may call the Specialty Clinic at [facility] at [phone] during business hours should I have any further questions or need assistance in obtaining follow-up care.    __________________________________________  _____________  __________ Signature of Patient or Authorized Representative            Date                   Time    __________________________________________ Nurse's Signature

## 2012-01-21 ENCOUNTER — Other Ambulatory Visit (HOSPITAL_COMMUNITY): Payer: Medicare Other

## 2012-01-30 ENCOUNTER — Ambulatory Visit (HOSPITAL_COMMUNITY): Payer: Medicare Other | Admitting: Oncology

## 2012-02-18 ENCOUNTER — Ambulatory Visit (HOSPITAL_COMMUNITY): Payer: Medicare Other | Admitting: Oncology

## 2012-02-22 ENCOUNTER — Other Ambulatory Visit (HOSPITAL_COMMUNITY): Payer: Medicare Other

## 2012-03-17 ENCOUNTER — Other Ambulatory Visit (HOSPITAL_COMMUNITY): Payer: Medicare Other

## 2012-04-14 ENCOUNTER — Other Ambulatory Visit (HOSPITAL_COMMUNITY): Payer: Medicare Other

## 2012-04-18 ENCOUNTER — Ambulatory Visit (HOSPITAL_COMMUNITY): Payer: Medicare Other | Admitting: Oncology

## 2012-04-18 ENCOUNTER — Encounter (HOSPITAL_COMMUNITY): Payer: Self-pay | Admitting: Oncology

## 2015-12-01 DIAGNOSIS — H524 Presbyopia: Secondary | ICD-10-CM | POA: Diagnosis not present

## 2016-01-15 ENCOUNTER — Emergency Department (HOSPITAL_COMMUNITY): Payer: Medicare Other

## 2016-01-15 ENCOUNTER — Observation Stay (HOSPITAL_COMMUNITY)
Admission: EM | Admit: 2016-01-15 | Discharge: 2016-01-17 | Disposition: A | Discharge status: Home / Self Care | Payer: Medicare Other | Attending: Internal Medicine | Admitting: Internal Medicine

## 2016-01-15 ENCOUNTER — Encounter (HOSPITAL_COMMUNITY): Payer: Self-pay | Admitting: Emergency Medicine

## 2016-01-15 DIAGNOSIS — I10 Essential (primary) hypertension: Secondary | ICD-10-CM | POA: Diagnosis not present

## 2016-01-15 DIAGNOSIS — F17210 Nicotine dependence, cigarettes, uncomplicated: Secondary | ICD-10-CM | POA: Diagnosis not present

## 2016-01-15 DIAGNOSIS — Z885 Allergy status to narcotic agent status: Secondary | ICD-10-CM | POA: Diagnosis not present

## 2016-01-15 DIAGNOSIS — M1990 Unspecified osteoarthritis, unspecified site: Secondary | ICD-10-CM | POA: Diagnosis not present

## 2016-01-15 DIAGNOSIS — E7800 Pure hypercholesterolemia, unspecified: Secondary | ICD-10-CM | POA: Insufficient documentation

## 2016-01-15 DIAGNOSIS — Z87891 Personal history of nicotine dependence: Secondary | ICD-10-CM

## 2016-01-15 DIAGNOSIS — R000 Tachycardia, unspecified: Secondary | ICD-10-CM | POA: Insufficient documentation

## 2016-01-15 DIAGNOSIS — D751 Secondary polycythemia: Secondary | ICD-10-CM | POA: Diagnosis not present

## 2016-01-15 DIAGNOSIS — Z7982 Long term (current) use of aspirin: Secondary | ICD-10-CM | POA: Insufficient documentation

## 2016-01-15 DIAGNOSIS — R0602 Shortness of breath: Secondary | ICD-10-CM | POA: Diagnosis not present

## 2016-01-15 DIAGNOSIS — K219 Gastro-esophageal reflux disease without esophagitis: Secondary | ICD-10-CM | POA: Diagnosis not present

## 2016-01-15 DIAGNOSIS — E876 Hypokalemia: Secondary | ICD-10-CM | POA: Insufficient documentation

## 2016-01-15 DIAGNOSIS — J441 Chronic obstructive pulmonary disease with (acute) exacerbation: Secondary | ICD-10-CM | POA: Diagnosis not present

## 2016-01-15 DIAGNOSIS — J449 Chronic obstructive pulmonary disease, unspecified: Secondary | ICD-10-CM | POA: Diagnosis present

## 2016-01-15 DIAGNOSIS — R05 Cough: Secondary | ICD-10-CM | POA: Diagnosis not present

## 2016-01-15 LAB — BASIC METABOLIC PANEL
Anion gap: 7 (ref 5–15)
BUN: 28 mg/dL — AB (ref 6–20)
CALCIUM: 9.7 mg/dL (ref 8.9–10.3)
CO2: 29 mmol/L (ref 22–32)
CREATININE: 1.23 mg/dL (ref 0.61–1.24)
Chloride: 102 mmol/L (ref 101–111)
GFR calc Af Amer: >60 mL/min (ref >60)
GFR calc non Af Amer: 58 mL/min — ABNORMAL LOW (ref >60)
GLUCOSE: 112 mg/dL — AB (ref 65–99)
Potassium: 3.4 mmol/L — ABNORMAL LOW (ref 3.5–5.1)
Sodium: 138 mmol/L (ref 135–145)

## 2016-01-15 LAB — CBC WITH DIFFERENTIAL/PLATELET
Basophils Absolute: 0.1 10*3/uL (ref 0.0–0.1)
Basophils Relative: 1 %
EOS PCT: 9 %
Eosinophils Absolute: 1.2 10*3/uL — ABNORMAL HIGH (ref 0.0–0.7)
HEMATOCRIT: 57.5 % — AB (ref 39.0–52.0)
Hemoglobin: 19.8 g/dL — ABNORMAL HIGH (ref 13.0–17.0)
LYMPHS PCT: 39 %
Lymphs Abs: 5.4 10*3/uL — ABNORMAL HIGH (ref 0.7–4.0)
MCH: 32.6 pg (ref 26.0–34.0)
MCHC: 34.4 g/dL (ref 30.0–36.0)
MCV: 94.7 fL (ref 78.0–100.0)
MONO ABS: 1.8 10*3/uL — AB (ref 0.1–1.0)
MONOS PCT: 13 %
NEUTROS ABS: 5.2 10*3/uL (ref 1.7–7.7)
Neutrophils Relative %: 38 %
Platelets: 208 10*3/uL (ref 150–400)
RBC: 6.07 MIL/uL — ABNORMAL HIGH (ref 4.22–5.81)
RDW: 14.1 % (ref 11.5–15.5)
WBC: 13.7 10*3/uL — ABNORMAL HIGH (ref 4.0–10.5)

## 2016-01-15 LAB — TROPONIN I: Troponin I: <0.03 ng/mL (ref <0.031)

## 2016-01-15 MED ORDER — ALBUTEROL SULFATE (2.5 MG/3ML) 0.083% IN NEBU
5.0000 mg | INHALATION_SOLUTION | Freq: Once | RESPIRATORY_TRACT | Status: AC
  Administered 2016-01-15: 5 mg via RESPIRATORY_TRACT
  Filled 2016-01-15: qty 6

## 2016-01-15 MED ORDER — IPRATROPIUM-ALBUTEROL 0.5-2.5 (3) MG/3ML IN SOLN
3.0000 mL | RESPIRATORY_TRACT | Status: DC
Start: 2016-01-16 — End: 2016-01-16
  Administered 2016-01-16 (×2): 3 mL via RESPIRATORY_TRACT
  Filled 2016-01-15 (×2): qty 3

## 2016-01-15 MED ORDER — NICOTINE 21 MG/24HR TD PT24
21.0000 mg | MEDICATED_PATCH | Freq: Every day | TRANSDERMAL | Status: DC
  Administered 2016-01-16: 21 mg via TRANSDERMAL
  Filled 2016-01-15 (×2): qty 1

## 2016-01-15 MED ORDER — METHYLPREDNISOLONE SODIUM SUCC 125 MG IJ SOLR
125.0000 mg | Freq: Once | INTRAMUSCULAR | Status: AC
  Administered 2016-01-15: 125 mg via INTRAVENOUS
  Filled 2016-01-15: qty 2

## 2016-01-15 MED ORDER — METHYLPREDNISOLONE SODIUM SUCC 125 MG IJ SOLR
125.0000 mg | Freq: Once | INTRAMUSCULAR | Status: AC
  Administered 2016-01-16: 125 mg via INTRAVENOUS
  Filled 2016-01-15: qty 2

## 2016-01-15 MED ORDER — ALBUTEROL (5 MG/ML) CONTINUOUS INHALATION SOLN
10.0000 mg/h | INHALATION_SOLUTION | RESPIRATORY_TRACT | Status: DC
  Administered 2016-01-15: 10 mg/h via RESPIRATORY_TRACT
  Filled 2016-01-15: qty 20

## 2016-01-15 NOTE — H&P (Signed)
[facility] Admission History and Physical       [name] [id number] DOB: [birth year] DOA: 01/15/2016  Referring physician: EDP PCP: Asencion Noble, MD  Specialists:   Chief Complaint: SOB   HPI: Jason Evans is a 70 y.o. male with COPD with worsening SOB x 3 week worse the past 3 days with increased Chest Tightness, and Wheezing.    He was treated with IV solumedrol and Continuous Neb Treatments and had mild relief and was referred for admission.      Review of Systems:  Constitutional: No Weight Loss, No Weight Gain, Night Sweats, Fevers, Chills, Dizziness, Light Headedness, Fatigue, or Generalized Weakness HEENT: No Headaches, Difficulty Swallowing,Tooth/Dental Problems,Sore Throat,  No Sneezing, Rhinitis, Ear Ache, Nasal Congestion, or Post Nasal Drip,  Cardio-vascular:  No Chest pain, Orthopnea, PND, Edema in Lower Extremities, Anasarca, Dizziness, Palpitations  Resp: No +Dyspnea, No DOE, No Productive Cough, No Non-Productive Cough, No Hemoptysis, +Wheezing.    GI: No Heartburn, Indigestion, Abdominal Pain, Nausea, Vomiting, Diarrhea, Constipation, Hematemesis, Hematochezia, Melena, Change in Bowel Habits,  Loss of Appetite  GU: No Dysuria, No Change in Color of Urine, No Urgency or Urinary Frequency, No Flank pain.  Musculoskeletal: No Joint Pain or Swelling, No Decreased Range of Motion, No Back Pain.  Neurologic: No Syncope, No Seizures, Muscle Weakness, Paresthesia, Vision Disturbance or Loss, No Diplopia, No Vertigo, No Difficulty Walking,  Skin: No Rash or Lesions. Psych: No Change in Mood or Affect, No Depression or Anxiety, No Memory loss, No Confusion, or Hallucinations   Past Medical History  Diagnosis Date  . Arthritis   . Esophageal reflux   . Hypercholesteremia   . Hypertension   . COPD (chronic obstructive pulmonary disease) ([place])   . Polycythemia   . DDD (degenerative disc disease)   . Polycythemia secondary to smoking 09/14/2011  .  COPD (chronic obstructive pulmonary disease) ([place]) 09/14/2011  . ETOHism ([place]) 09/14/2011  . Polycythemia secondary to smoking 09/14/2011    pO2 of 80.  Carbonmonoxide hemoglobin value of 6.5%.   Negative JAK mutation evaluation.  EPO level was at the high end of normal consistent with hypoxia-driven polycythemia.   Marland Kitchen History of tobacco abuse 12/21/2011    Quit Feb 2013     Past Surgical History  Procedure Laterality Date  . Cholecystectomy        Prior to Admission medications   Medication Sig Start Date End Date Taking? Authorizing Provider  amLODipine (NORVASC) 5 MG tablet Take 5 mg by mouth daily.     Yes Historical Provider, MD  aspirin 81 MG tablet Take 81 mg by mouth daily.     Yes Historical Provider, MD  lisinopril (PRINIVIL,ZESTRIL) 20 MG tablet Take 20 mg by mouth daily.     Yes Historical Provider, MD  omeprazole (PRILOSEC) 20 MG capsule Take 20 mg by mouth 2 (two) times daily before a meal.   Yes Historical Provider, MD     Allergies  Allergen Reactions  . Codeine Hives and Itching    Social History:  reports that he has been smoking.  He has never used smokeless tobacco. He reports that he drinks alcohol. He reports that he does not use illicit drugs.     History reviewed. No pertinent family history.     Physical Exam:  GEN:  Pleasant Well Nourished and Well Developed  70 y.o. Caucasian male examined and in no acute distress; cooperative with exam Filed Vitals:   01/15/16 2200 01/15/16 2204 01/15/16 2230 01/15/16  2300  BP: 120/91  97/70 91/70  Pulse: 82  90 107  Temp:      TempSrc:      Resp: 20  17 22   SpO2: 96% 95% 100% 100%   Blood pressure 91/70, pulse 107, temperature 98.4 F (36.9 C), temperature source Oral, resp. rate 22, SpO2 100 %. PSYCH: He is alert and oriented x4; does not appear anxious does not appear depressed; affect is normal HEENT: Normocephalic and Atraumatic, Mucous membranes pink; PERRLA; EOM intact; Fundi:  Benign;  No scleral  icterus, Nares: Patent, Oropharynx: Clear, Edentulous Upper Palate,    Neck:  FROM, No Cervical Lymphadenopathy nor Thyromegaly or Carotid Bruit; No JVD; Breasts:: Not examined CHEST WALL: No tenderness CHEST: Decreased Breath Sounds, Diffuse Expiratory Wheezes HEART: Regular rate and rhythm; no murmurs rubs or gallops BACK: No kyphosis or scoliosis; No CVA tenderness ABDOMEN: Positive Bowel Sounds,Soft Non-Tender, No Rebound or Guarding; No Masses, No Organomegaly. Rectal Exam: Not done EXTREMITIES: No Cyanosis, Clubbing, or Edema; No Ulcerations. Genitalia: not examined PULSES: 2+ and symmetric SKIN: Normal hydration no rash or ulceration CNS:  Alert and Oriented x 4, No Focal Deficits Vascular: pulses palpable throughout    Labs on Admission:  Basic Metabolic Panel:  Recent Labs Lab 01/15/16 2100  NA 138  K 3.4*  CL 102  CO2 29  GLUCOSE 112*  BUN 28*  CREATININE 1.23  CALCIUM 9.7   Liver Function Tests: No results for input(s): AST, ALT, ALKPHOS, BILITOT, PROT, ALBUMIN in the last 168 hours. No results for input(s): LIPASE, AMYLASE in the last 168 hours. No results for input(s): AMMONIA in the last 168 hours. CBC:  Recent Labs Lab 01/15/16 2100  WBC 13.7*  NEUTROABS 5.2  HGB 19.8*  HCT 57.5*  MCV 94.7  PLT 208   Cardiac Enzymes:  Recent Labs Lab 01/15/16 2100  TROPONINI <0.03    BNP (last 3 results) No results for input(s): BNP in the last 8760 hours.  ProBNP (last 3 results) No results for input(s): PROBNP in the last 8760 hours.  CBG: No results for input(s): GLUCAP in the last 168 hours.  Radiological Exams on Admission: Dg Chest 2 View  01/15/2016  CLINICAL DATA:  Shortness of breath and cough for 3 weeks. History of COPD. Former smoker. EXAM: CHEST  2 VIEW COMPARISON:  CT chest 11/24/2010 FINDINGS: Normal heart size and pulmonary vascularity. Emphysematous changes and scattered fibrosis in the lungs. No focal airspace disease or  consolidation. No blunting of costophrenic angles. No pneumothorax. Old left rib fractures. Degenerative changes in the spine and shoulders. IMPRESSION: Emphysematous changes and fibrosis in the lungs. No evidence of active pulmonary disease. Electronically Signed   By: Lucienne Capers M.D.   On: 01/15/2016 22:56     EKG: Independently reviewed.    Assessment/Plan:  70 y.o. male with  Active Problems:    COPD exacerbation (HCC)   IV Steroid Taper   DuoNebs   O2 PRN   Tobacco Abuse   Counseled Re: Cessation   Nicotine Patch daily   DVT Prophylaxis   Lovenox     Code Status:     FULL CODE     Family Communication:   Family (Wife and Son) at Bedside Disposition Plan:    Inpatient  Status        Time spent: [facility] Pager [phone]   If [place] Please Contact the Day Rounding Team MD for [facility]  If 7PM-7AM, Please Contact Night-Floor Coverage  [email or web address] Password [email or web address] 01/15/2016, 11:45 PM     ADDENDUM:   Patient was seen and examined on 01/15/2016

## 2016-01-15 NOTE — ED Notes (Signed)
Patient complaining of shortness of breath and cough x 3 weeks.

## 2016-01-15 NOTE — ED Provider Notes (Signed)
CSN: [id number]     Arrival date & time 01/15/16  2050 History  By signing my name below, I, Nicole Kindred, attest that this documentation has been prepared under the direction and in the presence of Theressa Millard, MD.   Electronically Signed: Nicole Kindred, ED Scribe. 01/15/2016. 11:34 PM  Chief Complaint  Patient presents with  . Shortness of Breath   Patient is a 70 y.o. male presenting with shortness of breath. The history is provided by the patient. No language interpreter was used.  Shortness of Breath Severity:  Moderate Onset quality:  Gradual Duration:  3 weeks Timing:  Unable to specify Progression:  Worsening Chronicity:  New Relieved by:  Nothing Worsened by:  Nothing tried Associated symptoms: cough and wheezing   Associated symptoms: no abdominal pain, no chest pain, no fever, no headaches, no neck pain, no rash and no vomiting    HPI Comments: Jason Evans is a 70 y.o. male with PMhx of HTN and COPD who presents to the Emergency Department complaining of gradual onset, shortness of breath, ongoing for about three weeks, worsening earlier tonight. Pt reports associated chest congestion and cough. No other associated symptoms noted. Pt has taken mucinex and a prescription from his PCP with no relief to symptoms. Pt denies chest pain, abdominal pain, leg swelling, or any other pertinent symptoms. No fever or chills.  Past Medical History  Diagnosis Date  . Arthritis   . Esophageal reflux   . Hypercholesteremia   . Hypertension   . COPD (chronic obstructive pulmonary disease) ([place])   . Polycythemia   . DDD (degenerative disc disease)   . Polycythemia secondary to smoking 09/14/2011  . COPD (chronic obstructive pulmonary disease) ([place]) 09/14/2011  . ETOHism ([place]) 09/14/2011  . Polycythemia secondary to smoking 09/14/2011    pO2 of 80.  Carbonmonoxide hemoglobin value of 6.5%.   Negative JAK mutation evaluation.  EPO level was at the high end of normal  consistent with hypoxia-driven polycythemia.   Marland Kitchen History of tobacco abuse 12/21/2011    Quit Feb 2013   Past Surgical History  Procedure Laterality Date  . Cholecystectomy     History reviewed. No pertinent family history. Social History  Substance Use Topics  . Smoking status: Current Every Day Smoker  . Smokeless tobacco: Never Used  . Alcohol Use: Yes     Comment: "occasionally"    Review of Systems  Constitutional: Negative for fever and chills.  HENT: Positive for congestion.   Respiratory: Positive for cough, shortness of breath and wheezing.   Cardiovascular: Negative for chest pain, palpitations and leg swelling.  Gastrointestinal: Negative for nausea, vomiting and abdominal pain.  Musculoskeletal: Negative for back pain, neck pain and neck stiffness.  Skin: Negative for rash and wound.  Neurological: Negative for dizziness, weakness, light-headedness, numbness and headaches.  All other systems reviewed and are negative.    Allergies  Codeine  Home Medications   Prior to Admission medications   Medication Sig Start Date End Date Taking? Authorizing Provider  amLODipine (NORVASC) 5 MG tablet Take 5 mg by mouth daily.     Yes Historical Provider, MD  aspirin 81 MG tablet Take 81 mg by mouth daily.     Yes Historical Provider, MD  lisinopril (PRINIVIL,ZESTRIL) 20 MG tablet Take 20 mg by mouth daily.     Yes Historical Provider, MD  omeprazole (PRILOSEC) 20 MG capsule Take 20 mg by mouth 2 (two) times daily before a meal.   Yes Historical  Provider, MD   BP 91/70 mmHg  Pulse 107  Temp(Src) 98.4 F (36.9 C) (Oral)  Resp 22  SpO2 100% Physical Exam  Constitutional: He is oriented to person, place, and time. He appears well-developed and well-nourished. No distress.  HENT:  Head: Normocephalic and atraumatic.  Mouth/Throat: Oropharynx is clear and moist.  Eyes: EOM are normal. Pupils are equal, round, and reactive to light.  Neck: Normal range of motion. Neck  supple. No JVD present.  Cardiovascular: Normal rate and regular rhythm.  Exam reveals no gallop and no friction rub.   No murmur heard. Pulmonary/Chest: Effort normal. No respiratory distress. He has wheezes (expiratory wheezing throughout). He has no rales. He exhibits no tenderness.  Abdominal: Soft. Bowel sounds are normal. He exhibits no distension and no mass. There is no tenderness. There is no rebound and no guarding.  Musculoskeletal: Normal range of motion. He exhibits no edema or tenderness.  No lower extremity asymmetry, tenderness or swelling. Distal pulses are equal and intact.  Neurological: He is alert and oriented to person, place, and time.  Skin: Skin is warm and dry. No rash noted. No erythema.  Psychiatric: He has a normal mood and affect. His behavior is normal.  Nursing note and vitals reviewed.   ED Course  Procedures (including critical care time) DIAGNOSTIC STUDIES:   COORDINATION OF CARE: 9:11 PM Discussed treatment plan which includes proventil, CXR, CBC with differential, and BMP with pt at bedside and pt agreed to plan.   Labs Review Labs Reviewed  CBC WITH DIFFERENTIAL/PLATELET - Abnormal; Notable for the following:    WBC 13.7 (*)    RBC 6.07 (*)    Hemoglobin 19.8 (*)    HCT 57.5 (*)    Lymphs Abs 5.4 (*)    Monocytes Absolute 1.8 (*)    Eosinophils Absolute 1.2 (*)    All other components within normal limits  BASIC METABOLIC PANEL - Abnormal; Notable for the following:    Potassium 3.4 (*)    Glucose, Bld 112 (*)    BUN 28 (*)    GFR calc non Af Amer 58 (*)    All other components within normal limits  TROPONIN I    Imaging Review Dg Chest 2 View  01/15/2016  CLINICAL DATA:  Shortness of breath and cough for 3 weeks. History of COPD. Former smoker. EXAM: CHEST  2 VIEW COMPARISON:  CT chest 11/24/2010 FINDINGS: Normal heart size and pulmonary vascularity. Emphysematous changes and scattered fibrosis in the lungs. No focal airspace disease  or consolidation. No blunting of costophrenic angles. No pneumothorax. Old left rib fractures. Degenerative changes in the spine and shoulders. IMPRESSION: Emphysematous changes and fibrosis in the lungs. No evidence of active pulmonary disease. Electronically Signed   By: Lucienne Capers M.D.   On: 01/15/2016 22:56   I have personally reviewed and evaluated these images and lab results as part of my medical decision-making.   EKG Interpretation   Date/Time:  Sunday Jan 15 2016 21:00:47 EDT Ventricular Rate:  109 PR Interval:  185 QRS Duration: 83 QT Interval:  332 QTC Calculation: 447 R Axis:   -64 Text Interpretation:  Sinus tachycardia Right atrial enlargement LAD,  consider left anterior fascicular block Confirmed by Kerstie Agent  MD, Jolonda Gomm  ([id number]) on 01/15/2016 9:26:05 PM      MDM   Final diagnoses:  COPD exacerbation ([place])   I personally performed the services described in this documentation, which was scribed in my presence. The recorded information  has been reviewed and is accurate.     Patient with persistent wheezing despite continuous breathing treatment. Discussed with Dr. Arnoldo Morale and will admit for observation bed.  Julianne Rice, MD 01/15/16 [phone]

## 2016-01-16 DIAGNOSIS — J441 Chronic obstructive pulmonary disease with (acute) exacerbation: Secondary | ICD-10-CM | POA: Diagnosis not present

## 2016-01-16 LAB — BASIC METABOLIC PANEL
Anion gap: 9 (ref 5–15)
BUN: 29 mg/dL — AB (ref 6–20)
CALCIUM: 9.5 mg/dL (ref 8.9–10.3)
CO2: 25 mmol/L (ref 22–32)
Chloride: 104 mmol/L (ref 101–111)
Creatinine, Ser: 1.21 mg/dL (ref 0.61–1.24)
GFR calc Af Amer: >60 mL/min (ref >60)
GFR, EST NON AFRICAN AMERICAN: 59 mL/min — AB (ref >60)
Glucose, Bld: 183 mg/dL — ABNORMAL HIGH (ref 65–99)
POTASSIUM: 3.4 mmol/L — AB (ref 3.5–5.1)
SODIUM: 138 mmol/L (ref 135–145)

## 2016-01-16 LAB — CBC
HEMATOCRIT: 50.7 % (ref 39.0–52.0)
Hemoglobin: 16.9 g/dL (ref 13.0–17.0)
MCH: 31.5 pg (ref 26.0–34.0)
MCHC: 33.3 g/dL (ref 30.0–36.0)
MCV: 94.4 fL (ref 78.0–100.0)
PLATELETS: 186 10*3/uL (ref 150–400)
RBC: 5.37 MIL/uL (ref 4.22–5.81)
RDW: 14.1 % (ref 11.5–15.5)
WBC: 5.5 10*3/uL (ref 4.0–10.5)

## 2016-01-16 MED ORDER — AMLODIPINE BESYLATE 5 MG PO TABS
5.0000 mg | ORAL_TABLET | Freq: Every day | ORAL | Status: DC
  Administered 2016-01-16: 5 mg via ORAL
  Filled 2016-01-16 (×2): qty 1

## 2016-01-16 MED ORDER — ONDANSETRON HCL 4 MG/2ML IJ SOLN: 4.0000 mg | Freq: Four times a day (QID) | INTRAMUSCULAR | Status: DC | PRN

## 2016-01-16 MED ORDER — METHYLPREDNISOLONE SODIUM SUCC 125 MG IJ SOLR
125.0000 mg | Freq: Four times a day (QID) | INTRAMUSCULAR | Status: DC
  Administered 2016-01-16 – 2016-01-17 (×4): 125 mg via INTRAVENOUS
  Filled 2016-01-16 (×4): qty 2

## 2016-01-16 MED ORDER — SODIUM CHLORIDE 0.9% FLUSH
3.0000 mL | Freq: Two times a day (BID) | INTRAVENOUS | Status: DC
  Administered 2016-01-16 (×3): 3 mL via INTRAVENOUS

## 2016-01-16 MED ORDER — PANTOPRAZOLE SODIUM 40 MG PO TBEC
40.0000 mg | DELAYED_RELEASE_TABLET | Freq: Every day | ORAL | Status: DC
  Administered 2016-01-16: 40 mg via ORAL
  Filled 2016-01-16 (×2): qty 1

## 2016-01-16 MED ORDER — IPRATROPIUM-ALBUTEROL 0.5-2.5 (3) MG/3ML IN SOLN
3.0000 mL | Freq: Three times a day (TID) | RESPIRATORY_TRACT | Status: DC
  Administered 2016-01-16 (×2): 3 mL via RESPIRATORY_TRACT
  Filled 2016-01-16 (×3): qty 3

## 2016-01-16 MED ORDER — LISINOPRIL 10 MG PO TABS
20.0000 mg | ORAL_TABLET | Freq: Every day | ORAL | Status: DC
  Administered 2016-01-16: 20 mg via ORAL
  Filled 2016-01-16 (×2): qty 2

## 2016-01-16 MED ORDER — ACETAMINOPHEN 325 MG PO TABS
650.0000 mg | ORAL_TABLET | Freq: Four times a day (QID) | ORAL | Status: DC | PRN
  Administered 2016-01-16: 650 mg via ORAL
  Filled 2016-01-16: qty 2

## 2016-01-16 MED ORDER — ACETAMINOPHEN 650 MG RE SUPP: 650.0000 mg | Freq: Four times a day (QID) | RECTAL | Status: DC | PRN

## 2016-01-16 MED ORDER — HYDROMORPHONE HCL 1 MG/ML IJ SOLN: 0.5000 mg | INTRAMUSCULAR | Status: DC | PRN

## 2016-01-16 MED ORDER — IPRATROPIUM-ALBUTEROL 0.5-2.5 (3) MG/3ML IN SOLN: 3.0000 mL | RESPIRATORY_TRACT | Status: DC | PRN

## 2016-01-16 MED ORDER — SODIUM CHLORIDE 0.9 % IV SOLN: 250.0000 mL | INTRAVENOUS | Status: DC | PRN

## 2016-01-16 MED ORDER — ENOXAPARIN SODIUM 40 MG/0.4ML ~~LOC~~ SOLN
40.0000 mg | SUBCUTANEOUS | Status: DC
  Administered 2016-01-16 – 2016-01-17 (×2): 40 mg via SUBCUTANEOUS
  Filled 2016-01-16 (×2): qty 0.4

## 2016-01-16 MED ORDER — ASPIRIN 81 MG PO CHEW
81.0000 mg | CHEWABLE_TABLET | Freq: Every day | ORAL | Status: DC
  Administered 2016-01-16: 81 mg via ORAL
  Filled 2016-01-16 (×2): qty 1

## 2016-01-16 MED ORDER — SODIUM CHLORIDE 0.9% FLUSH: 3.0000 mL | INTRAVENOUS | Status: DC | PRN

## 2016-01-16 MED ORDER — POTASSIUM CHLORIDE CRYS ER 20 MEQ PO TBCR
20.0000 meq | EXTENDED_RELEASE_TABLET | Freq: Three times a day (TID) | ORAL | Status: DC
Start: 2016-01-16 — End: 2016-01-17
  Administered 2016-01-16 – 2016-01-17 (×3): 20 meq via ORAL
  Filled 2016-01-16 (×4): qty 1

## 2016-01-16 MED ORDER — ONDANSETRON HCL 4 MG PO TABS: 4.0000 mg | ORAL_TABLET | Freq: Four times a day (QID) | ORAL | Status: DC | PRN

## 2016-01-16 NOTE — Progress Notes (Signed)
Subjective: He was admitted yesterday with shortness of breath, coughing and wheezing. He states he has now quit smoking after this admission. He has been smoking 2 packs per day.  Objective: Vital signs in last 24 hours: Filed Vitals:   01/16/16 0108 01/16/16 0129 01/16/16 0450 01/16/16 0506  BP:  161/80  146/82  Pulse: 101 96  105  Temp:    97.9 F (36.6 C)  TempSrc:    Oral  Resp: 18   18  Height:  5' 8.5" (1.74 m)    Weight:  165 lb (74.844 kg)    SpO2: 98% 100% 96% 96%   Weight change:  No intake or output data in the 24 hours ending 01/16/16 0741  Physical Exam: Alert. No distress. Lungs reveal bilateral wheezes. Heart regular with no murmurs. Abdomen nontender with no hepatosplenomegaly. Extremities reveal no edema.  Lab Results:    Results for orders placed or performed during the hospital encounter of 01/15/16 (from the past 24 hour(s))  CBC with Differential     Status: Abnormal   Collection Time: 01/15/16  9:00 PM  Result Value Ref Range   WBC 13.7 (H) 4.0 - 10.5 K/uL   RBC 6.07 (H) 4.22 - 5.81 MIL/uL   Hemoglobin 19.8 (H) 13.0 - 17.0 g/dL   HCT 57.5 (H) 39.0 - 52.0 %   MCV 94.7 78.0 - 100.0 fL   MCH 32.6 26.0 - 34.0 pg   MCHC 34.4 30.0 - 36.0 g/dL   RDW 14.1 11.5 - 15.5 %   Platelets 208 150 - 400 K/uL   Neutrophils Relative % 38 %   Neutro Abs 5.2 1.7 - 7.7 K/uL   Lymphocytes Relative 39 %   Lymphs Abs 5.4 (H) 0.7 - 4.0 K/uL   Monocytes Relative 13 %   Monocytes Absolute 1.8 (H) 0.1 - 1.0 K/uL   Eosinophils Relative 9 %   Eosinophils Absolute 1.2 (H) 0.0 - 0.7 K/uL   Basophils Relative 1 %   Basophils Absolute 0.1 0.0 - 0.1 K/uL  Basic metabolic panel     Status: Abnormal   Collection Time: 01/15/16  9:00 PM  Result Value Ref Range   Sodium 138 135 - 145 mmol/L   Potassium 3.4 (L) 3.5 - 5.1 mmol/L   Chloride 102 101 - 111 mmol/L   CO2 29 22 - 32 mmol/L   Glucose, Bld 112 (H) 65 - 99 mg/dL   BUN 28 (H) 6 - 20 mg/dL   Creatinine, Ser 1.23 0.61 -  1.24 mg/dL   Calcium 9.7 8.9 - 10.3 mg/dL   GFR calc non Af Amer 58 (L) >60 mL/min   GFR calc Af Amer >60 >60 mL/min   Anion gap 7 5 - 15  Troponin I     Status: None   Collection Time: 01/15/16  9:00 PM  Result Value Ref Range   Troponin I <0.03 <0.031 ng/mL  CBC     Status: None   Collection Time: 01/16/16  6:49 AM  Result Value Ref Range   WBC 5.5 4.0 - 10.5 K/uL   RBC 5.37 4.22 - 5.81 MIL/uL   Hemoglobin 16.9 13.0 - 17.0 g/dL   HCT 50.7 39.0 - 52.0 %   MCV 94.4 78.0 - 100.0 fL   MCH 31.5 26.0 - 34.0 pg   MCHC 33.3 30.0 - 36.0 g/dL   RDW 14.1 11.5 - 15.5 %   Platelets 186 150 - 400 K/uL     ABGS No results for  input(s): PHART, PO2ART, TCO2, HCO3 in the last 72 hours.  Invalid input(s): PCO2 CULTURES No results found for this or any previous visit (from the past 240 hour(s)). Studies/Results: Dg Chest 2 View  01/15/2016  CLINICAL DATA:  Shortness of breath and cough for 3 weeks. History of COPD. Former smoker. EXAM: CHEST  2 VIEW COMPARISON:  CT chest 11/24/2010 FINDINGS: Normal heart size and pulmonary vascularity. Emphysematous changes and scattered fibrosis in the lungs. No focal airspace disease or consolidation. No blunting of costophrenic angles. No pneumothorax. Old left rib fractures. Degenerative changes in the spine and shoulders. IMPRESSION: Emphysematous changes and fibrosis in the lungs. No evidence of active pulmonary disease. Electronically Signed   By: Lucienne Capers M.D.   On: 01/15/2016 22:56   Micro Results: No results found for this or any previous visit (from the past 240 hour(s)). Studies/Results: Dg Chest 2 View  01/15/2016  CLINICAL DATA:  Shortness of breath and cough for 3 weeks. History of COPD. Former smoker. EXAM: CHEST  2 VIEW COMPARISON:  CT chest 11/24/2010 FINDINGS: Normal heart size and pulmonary vascularity. Emphysematous changes and scattered fibrosis in the lungs. No focal airspace disease or consolidation. No blunting of costophrenic  angles. No pneumothorax. Old left rib fractures. Degenerative changes in the spine and shoulders. IMPRESSION: Emphysematous changes and fibrosis in the lungs. No evidence of active pulmonary disease. Electronically Signed   By: Lucienne Capers M.D.   On: 01/15/2016 22:56   Medications:  I have reviewed the patient's current medications Scheduled Meds: . amLODipine  5 mg Oral Daily  . aspirin  81 mg Oral Daily  . enoxaparin (LOVENOX) injection  40 mg Subcutaneous Q24H  . ipratropium-albuterol  3 mL Nebulization Q4H  . lisinopril  20 mg Oral Daily  . methylPREDNISolone (SOLU-MEDROL) injection  125 mg Intravenous Q6H  . nicotine  21 mg Transdermal Daily  . pantoprazole  40 mg Oral Daily  . sodium chloride flush  3 mL Intravenous Q12H   Continuous Infusions:  PRN Meds:.sodium chloride, acetaminophen **OR** acetaminophen, ondansetron **OR** ondansetron (ZOFRAN) IV, sodium chloride flush   Assessment/Plan: #1. COPD exacerbation. Improved. He is feeling better. Continue DuoNeb treatments. Continue intravenous Solu-Medrol. Counseled regarding smoking cessation. #2. Polycythemia. Hemoglobin improved from 19.8-16.9. #3. Hypokalemia. Replace orally. 3.4. Active Problems:   Polycythemia secondary to smoking   History of tobacco abuse   COPD exacerbation (HCC)   Benign essential HTN     LOS: 1 day   Oris Calmes 01/16/2016, 7:41 AM

## 2016-01-16 NOTE — Care Management Obs Status (Signed)
[place] NOTIFICATION   Patient Details  Name: Jason Evans MRN: [id number] Date of Birth: 06/18/1946   Medicare Observation Status Notification Given:  Yes    Sherald Barge, RN 01/16/2016, 2:54 PM

## 2016-01-16 NOTE — Care Management Note (Signed)
Case Management Note  Patient Details  Name: Jason Evans MRN: [id number] Date of Birth: Feb 04, 1946  Subjective/Objective:                  Pt admitted with COPD. Pt is from home, lives with wife and is ind with ADL's. Pt has PCP, transportation and no difficulty affording medications. Pt works part time. Pt plans to return home with self care at DC. Pt has no DME PTA and does not anticipate needing any at DC.   Action/Plan: No CM needs.   Expected Discharge Date:      01/17/2016            Expected Discharge Plan:  Home/Self Care  In-House Referral:  NA  Discharge planning Services  CM Consult  Post Acute Care Choice:  NA Choice offered to:  NA  DME Arranged:    DME Agency:     HH Arranged:    HH Agency:     Status of Service:  Completed, signed off  Medicare Important Message Given:    Date Medicare IM Given:    Medicare IM give by:    Date Additional Medicare IM Given:    Additional Medicare Important Message give by:     If discussed at [place] of Stay Meetings, dates discussed:    Additional Comments:  Sherald Barge, RN 01/16/2016, 2:54 PM

## 2016-01-17 DIAGNOSIS — J441 Chronic obstructive pulmonary disease with (acute) exacerbation: Secondary | ICD-10-CM | POA: Diagnosis not present

## 2016-01-17 MED ORDER — ALBUTEROL SULFATE HFA 108 (90 BASE) MCG/ACT IN AERS: 2.0000 | INHALATION_SPRAY | Freq: Four times a day (QID) | RESPIRATORY_TRACT | Status: AC | PRN

## 2016-01-17 MED ORDER — PREDNISONE 20 MG PO TABS: 20.0000 mg | ORAL_TABLET | Freq: Two times a day (BID) | ORAL | Status: AC

## 2016-01-17 NOTE — Discharge Summary (Signed)
Physician Discharge Summary  Jason Evans [id number] DOB: [birth year] DOA: 01/15/2016   Admit date: 01/15/2016 Discharge date: 01/17/2016  Discharge Diagnoses:  Active Problems:   Polycythemia secondary to smoking   COPD (chronic obstructive pulmonary disease) (HCC)   History of tobacco abuse   COPD exacerbation (HCC)   Benign essential HTN    Wt Readings from Last 3 Encounters:  01/16/16 165 lb (74.844 kg)  12/21/11 188 lb 3.2 oz (85.367 kg)  09/14/11 172 lb (78.019 kg)     Hospital Course:  This patient is a 70 year old male who presented with shortness of breath and wheezing. His chest x-ray revealed emphysematous changes but no acute infiltrate. He was treated with inhaled bronchodilators and intravenous Solu-Medrol. Initially had a mild leukocytosis which resolved. He had a hypokalemia at 3.4 which was treated with supplemental potassium. He had no fever. He responded well to treatment. Shortness of breath and wheezing resolved. Condition at discharge is much improved. Exam at discharge feels no increased difficulty breathing. He has diminished breath sounds. Heart rhythm is regular with no murmur. Extremities reveal no edema.  He is being transitioned to oral prednisone which will be tapered. He will be treated with albuterol by metered-dose inhaler 4 times daily. He was counseled to discontinue tobacco use. He will be seen in follow-up in my office in one week. He'll continue his usual antihypertensive regimen.  He has secondary polycythemia which has been stable. Discharge hemoglobin is 16.9. He has been followed in hematology clinic.   Discharge Instructions     Medication List    TAKE these medications        albuterol 108 (90 Base) MCG/ACT inhaler  Commonly known as:  PROVENTIL HFA;VENTOLIN HFA  Inhale 2 puffs into the lungs every 6 (six) hours as needed for wheezing or shortness of breath.     amLODipine 2.5 MG tablet  Commonly known as:  NORVASC  Take 2.5  mg by mouth at bedtime.     aspirin 81 MG tablet  Take 81 mg by mouth daily.     lisinopril 40 MG tablet  Commonly known as:  PRINIVIL,ZESTRIL  Take 20 mg by mouth 2 (two) times daily.     naproxen 500 MG tablet  Commonly known as:  NAPROSYN  Take 500 mg by mouth as needed for mild pain.     omeprazole 20 MG capsule  Commonly known as:  PRILOSEC  Take 20 mg by mouth 2 (two) times daily before a meal.     predniSONE 20 MG tablet  Commonly known as:  DELTASONE  Take 1 tablet (20 mg total) by mouth 2 (two) times daily with a meal.     zolpidem 5 MG tablet  Commonly known as:  AMBIEN  Take 5 mg by mouth at bedtime as needed for sleep.         Jason Evans 01/17/2016

## 2016-01-17 NOTE — Progress Notes (Signed)
Patient states understanding of discharge instructions.  

## 2016-01-20 DIAGNOSIS — J441 Chronic obstructive pulmonary disease with (acute) exacerbation: Secondary | ICD-10-CM | POA: Diagnosis not present

## 2016-01-20 DIAGNOSIS — Z6823 Body mass index (BMI) 23.0-23.9, adult: Secondary | ICD-10-CM | POA: Diagnosis not present

## 2016-02-20 DIAGNOSIS — J441 Chronic obstructive pulmonary disease with (acute) exacerbation: Secondary | ICD-10-CM | POA: Diagnosis not present

## 2016-03-05 DIAGNOSIS — I10 Essential (primary) hypertension: Secondary | ICD-10-CM | POA: Diagnosis not present

## 2016-03-05 DIAGNOSIS — J441 Chronic obstructive pulmonary disease with (acute) exacerbation: Secondary | ICD-10-CM | POA: Diagnosis not present

## 2016-04-05 DIAGNOSIS — I10 Essential (primary) hypertension: Secondary | ICD-10-CM | POA: Diagnosis not present

## 2016-04-05 DIAGNOSIS — J441 Chronic obstructive pulmonary disease with (acute) exacerbation: Secondary | ICD-10-CM | POA: Diagnosis not present

## 2016-04-12 DIAGNOSIS — I10 Essential (primary) hypertension: Secondary | ICD-10-CM | POA: Diagnosis not present

## 2016-04-12 DIAGNOSIS — J449 Chronic obstructive pulmonary disease, unspecified: Secondary | ICD-10-CM | POA: Diagnosis not present

## 2016-04-13 ENCOUNTER — Other Ambulatory Visit (HOSPITAL_COMMUNITY): Payer: Self-pay | Admitting: Respiratory Therapy

## 2016-04-13 DIAGNOSIS — J441 Chronic obstructive pulmonary disease with (acute) exacerbation: Secondary | ICD-10-CM

## 2016-04-20 ENCOUNTER — Ambulatory Visit (HOSPITAL_COMMUNITY)
Admission: RE | Admit: 2016-04-20 | Discharge: 2016-04-20 | Disposition: A | Discharge status: Home / Self Care | Payer: Medicare Other | Source: Ambulatory Visit | Attending: Pulmonary Disease | Admitting: Pulmonary Disease

## 2016-04-20 DIAGNOSIS — J441 Chronic obstructive pulmonary disease with (acute) exacerbation: Secondary | ICD-10-CM | POA: Insufficient documentation

## 2016-04-20 MED ORDER — ALBUTEROL SULFATE (2.5 MG/3ML) 0.083% IN NEBU
2.5000 mg | INHALATION_SOLUTION | Freq: Once | RESPIRATORY_TRACT | Status: AC
  Administered 2016-04-20: 2.5 mg via RESPIRATORY_TRACT

## 2016-04-23 LAB — PULMONARY FUNCTION TEST
DL/VA % pred: 74 %
DL/VA: 3.33 ml/min/mmHg/L
DLCO UNC % PRED: 51 %
DLCO unc: 15.14 ml/min/mmHg
FEF 25-75 PRE: 1.04 L/s
FEF 25-75 Post: 2.39 L/sec
FEF2575-%Change-Post: 128 %
FEF2575-%PRED-PRE: 45 %
FEF2575-%Pred-Post: 104 %
FEV1-%Change-Post: 23 %
FEV1-%PRED-POST: 81 %
FEV1-%Pred-Pre: 66 %
FEV1-POST: 2.44 L
FEV1-PRE: 1.98 L
FEV1FVC-%Change-Post: 1 %
FEV1FVC-%Pred-Pre: 89 %
FEV6-%CHANGE-POST: 22 %
FEV6-%PRED-POST: 92 %
FEV6-%PRED-PRE: 75 %
FEV6-POST: 3.55 L
FEV6-Pre: 2.91 L
FEV6FVC-%Change-Post: 0 %
FEV6FVC-%PRED-POST: 104 %
FEV6FVC-%Pred-Pre: 103 %
FVC-%Change-Post: 21 %
FVC-%Pred-Post: 88 %
FVC-%Pred-Pre: 72 %
FVC-POST: 3.62 L
FVC-Pre: 2.98 L
POST FEV6/FVC RATIO: 98 %
PRE FEV1/FVC RATIO: 66 %
Post FEV1/FVC ratio: 67 %
Pre FEV6/FVC Ratio: 97 %
RV % pred: 268 %
RV: 6.28 L
TLC % PRED: 145 %
TLC: 9.62 L

## 2016-05-03 DIAGNOSIS — J449 Chronic obstructive pulmonary disease, unspecified: Secondary | ICD-10-CM | POA: Diagnosis not present

## 2017-07-04 DEATH — deceased
